# Patient Record
Sex: Female | Born: 1990 | Hispanic: Yes | Marital: Married | Smoking: Never smoker
Health system: Southern US, Community
[De-identification: ages and names within clinical notes are randomized; demographics above are authoritative.]

## PROBLEM LIST (undated history)

## (undated) ENCOUNTER — Inpatient Hospital Stay (HOSPITAL_COMMUNITY): Payer: Self-pay

## (undated) DIAGNOSIS — Z789 Other specified health status: Secondary | ICD-10-CM

## (undated) HISTORY — PX: APPENDECTOMY: SHX54

---

## 2017-08-12 ENCOUNTER — Other Ambulatory Visit: Payer: Self-pay | Admitting: Nurse Practitioner

## 2017-08-12 DIAGNOSIS — N63 Unspecified lump in unspecified breast: Secondary | ICD-10-CM

## 2017-08-25 ENCOUNTER — Inpatient Hospital Stay (HOSPITAL_COMMUNITY): Payer: Self-pay

## 2017-08-25 ENCOUNTER — Inpatient Hospital Stay (HOSPITAL_COMMUNITY)
Admission: AD | Admit: 2017-08-25 | Discharge: 2017-08-25 | Disposition: A | Discharge status: Home / Self Care | Payer: Self-pay | Source: Ambulatory Visit | Attending: Obstetrics & Gynecology | Admitting: Obstetrics & Gynecology

## 2017-08-25 ENCOUNTER — Encounter (HOSPITAL_COMMUNITY): Payer: Self-pay | Admitting: *Deleted

## 2017-08-25 DIAGNOSIS — O209 Hemorrhage in early pregnancy, unspecified: Secondary | ICD-10-CM

## 2017-08-25 DIAGNOSIS — Z3491 Encounter for supervision of normal pregnancy, unspecified, first trimester: Secondary | ICD-10-CM

## 2017-08-25 DIAGNOSIS — R109 Unspecified abdominal pain: Secondary | ICD-10-CM | POA: Insufficient documentation

## 2017-08-25 DIAGNOSIS — Z3A01 Less than 8 weeks gestation of pregnancy: Secondary | ICD-10-CM | POA: Insufficient documentation

## 2017-08-25 DIAGNOSIS — O26892 Other specified pregnancy related conditions, second trimester: Secondary | ICD-10-CM | POA: Insufficient documentation

## 2017-08-25 DIAGNOSIS — O208 Other hemorrhage in early pregnancy: Secondary | ICD-10-CM | POA: Insufficient documentation

## 2017-08-25 HISTORY — DX: Other specified health status: Z78.9

## 2017-08-25 LAB — CBC
HEMATOCRIT: 39.3 % (ref 36.0–46.0)
Hemoglobin: 13.7 g/dL (ref 12.0–15.0)
MCH: 29.8 pg (ref 26.0–34.0)
MCHC: 34.9 g/dL (ref 30.0–36.0)
MCV: 85.6 fL (ref 78.0–100.0)
Platelets: 311 10*3/uL (ref 150–400)
RBC: 4.59 MIL/uL (ref 3.87–5.11)
RDW: 12.8 % (ref 11.5–15.5)
WBC: 10.1 10*3/uL (ref 4.0–10.5)

## 2017-08-25 LAB — ABO/RH: ABO/RH(D): O POS

## 2017-08-25 LAB — URINALYSIS, ROUTINE W REFLEX MICROSCOPIC
Bilirubin Urine: NEGATIVE
GLUCOSE, UA: NEGATIVE mg/dL
Hgb urine dipstick: NEGATIVE
Ketones, ur: NEGATIVE mg/dL
LEUKOCYTES UA: NEGATIVE
Nitrite: NEGATIVE
PH: 6 (ref 5.0–8.0)
Protein, ur: NEGATIVE mg/dL
Specific Gravity, Urine: 1.01 (ref 1.005–1.030)

## 2017-08-25 LAB — OB RESULTS CONSOLE RUBELLA ANTIBODY, IGM: Rubella: IMMUNE

## 2017-08-25 LAB — HCG, QUANTITATIVE, PREGNANCY: hCG, Beta Chain, Quant, S: 107508 m[IU]/mL — ABNORMAL HIGH (ref <5)

## 2017-08-25 LAB — OB RESULTS CONSOLE RPR: RPR: NONREACTIVE

## 2017-08-25 LAB — WET PREP, GENITAL
SPERM: NONE SEEN
Trich, Wet Prep: NONE SEEN
YEAST WET PREP: NONE SEEN

## 2017-08-25 LAB — OB RESULTS CONSOLE HIV ANTIBODY (ROUTINE TESTING): HIV: NONREACTIVE

## 2017-08-25 LAB — OB RESULTS CONSOLE HEPATITIS B SURFACE ANTIGEN: Hepatitis B Surface Ag: NEGATIVE

## 2017-08-25 LAB — OB RESULTS CONSOLE GC/CHLAMYDIA: GC PROBE AMP, GENITAL: NEGATIVE

## 2017-08-25 NOTE — MAU Provider Note (Signed)
Chief Complaint: Abdominal Pain and Vaginal Bleeding   None     SUBJECTIVE HPI: Sarah Miles is a 26 y.o. G1P0 at [redacted]w[redacted]d by LMP who presents to maternity admissions reporting vaginal spotting 2 weeks ago and again today associated with mild cramping. She is active, doing dance and Zumba twice weekly. She reports intercourse within the last 3 days.  She has not tried any treatments, nothing makes the bleeding more or less.  There are no other associated symptoms.  She denies vaginal itching/burning, urinary symptoms, h/a, dizziness, n/v, or fever/chills.     HPI  Past Medical History:  Diagnosis Date  . Medical history non-contributory    Past Surgical History:  Procedure Laterality Date  . APPENDECTOMY     Social History   Social History  . Marital status: Married    Spouse name: N/A  . Number of children: N/A  . Years of education: N/A   Occupational History  . Not on file.   Social History Main Topics  . Smoking status: Never Smoker  . Smokeless tobacco: Never Used  . Alcohol use No  . Drug use: No  . Sexual activity: Yes   Other Topics Concern  . Not on file   Social History Narrative  . No narrative on file   No current facility-administered medications on file prior to encounter.    No current outpatient prescriptions on file prior to encounter.   No Known Allergies  ROS:  Review of Systems  Constitutional: Negative for chills, fatigue and fever.  Respiratory: Negative for shortness of breath.   Cardiovascular: Negative for chest pain.  Gastrointestinal: Positive for abdominal pain.  Genitourinary: Positive for pelvic pain and vaginal bleeding. Negative for difficulty urinating, dysuria, flank pain, vaginal discharge and vaginal pain.  Musculoskeletal: Negative for back pain.  Neurological: Negative for dizziness and headaches.  Psychiatric/Behavioral: Negative.      I have reviewed patient's Past Medical Hx, Surgical Hx, Family Hx, Social Hx,  medications and allergies.   Physical Exam  Patient Vitals for the past 24 hrs:  BP Temp Temp src Pulse Resp  08/25/17 1157 103/64 98.7 F (37.1 C) Oral 61 16   Constitutional: Well-developed, well-nourished female in no acute distress.  Cardiovascular: normal rate Respiratory: normal effort GI: Abd soft, non-tender. Pos BS x 4 MS: Extremities nontender, no edema, normal ROM Neurologic: Alert and oriented x 4.  GU: Neg CVAT.  PELVIC EXAM: Cervix pink, visually closed, without lesion, scant brown discharge, vaginal walls and external genitalia normal Bimanual exam: Cervix 0/long/high, firm, anterior, neg CMT, uterus nontender, slightly enlarged, adnexa without tenderness, enlargement, or mass   LAB RESULTS Results for orders placed or performed during the hospital encounter of 08/25/17 (from the past 24 hour(s))  Urinalysis, Routine w reflex microscopic     Status: None   Collection Time: 08/25/17 12:05 PM  Result Value Ref Range   Color, Urine YELLOW YELLOW   APPearance CLEAR CLEAR   Specific Gravity, Urine 1.010 1.005 - 1.030   pH 6.0 5.0 - 8.0   Glucose, UA NEGATIVE NEGATIVE mg/dL   Hgb urine dipstick NEGATIVE NEGATIVE   Bilirubin Urine NEGATIVE NEGATIVE   Ketones, ur NEGATIVE NEGATIVE mg/dL   Protein, ur NEGATIVE NEGATIVE mg/dL   Nitrite NEGATIVE NEGATIVE   Leukocytes, UA NEGATIVE NEGATIVE  hCG, quantitative, pregnancy     Status: Abnormal   Collection Time: 08/25/17 12:30 PM  Result Value Ref Range   hCG, Beta Chain, Quant, S 107,508 (H) <5 mIU/mL  CBC     Status: None   Collection Time: 08/25/17 12:34 PM  Result Value Ref Range   WBC 10.1 4.0 - 10.5 K/uL   RBC 4.59 3.87 - 5.11 MIL/uL   Hemoglobin 13.7 12.0 - 15.0 g/dL   HCT 16.139.3 09.636.0 - 04.546.0 %   MCV 85.6 78.0 - 100.0 fL   MCH 29.8 26.0 - 34.0 pg   MCHC 34.9 30.0 - 36.0 g/dL   RDW 40.912.8 81.111.5 - 91.415.5 %   Platelets 311 150 - 400 K/uL  ABO/Rh     Status: None (Preliminary result)   Collection Time: 08/25/17 12:34 PM   Result Value Ref Range   ABO/RH(D) O POS   Wet prep, genital     Status: Abnormal   Collection Time: 08/25/17 12:50 PM  Result Value Ref Range   Yeast Wet Prep HPF POC NONE SEEN NONE SEEN   Trich, Wet Prep NONE SEEN NONE SEEN   Clue Cells Wet Prep HPF POC PRESENT (A) NONE SEEN   WBC, Wet Prep HPF POC MODERATE (A) NONE SEEN   Sperm NONE SEEN     --/--/O POS (09/03 1234)  IMAGING [place] Ob Comp Less 14 Wks  Result Date: 08/25/2017 CLINICAL DATA:  26 year old pregnant female presents with spotting. EDC by LMP: 04/13/2018, projecting to an expected gestational age of [redacted] weeks 0 days. EXAM: OBSTETRIC <14 WK US AND TRANSVAGINAL OB US TECHNIQUE: Both transabdominal and transvaginal ultrasound examinations were performed for complete evaluation of the gestation as well as the maternal uterus, adnexal regions, and pelvic cul-de-sac. Transvaginal technique was performed to assess early pregnancy. COMPARISON:  None. FINDINGS: Intrauterine gestational sac: Single intrauterine gestational sac is normal in size, shape and position. Yolk sac:  Visualized. Embryo:  Visualized. Embryonic Cardiac Activity: Regular rate and rhythm. Embryonic Heart Rate: 144  bpm CRL:  10.6  mm   7 w   1 d                  US EDC: 04/12/2018 Subchorionic hemorrhage: Small perigestational bleed in the lower cavity measuring 1.8 x 0.5 cm, involving approximately 30% of the gestational sac circumference. Maternal uterus/adnexae: Left ovary measures 3.7 x 2.8 x 2.2 cm and contains a corpus luteum. Right ovary measures 2.9 x 2.1 x 2.8 cm. No suspicious ovarian or adnexal masses. No uterine fibroids. No abnormal free fluid in the pelvis. IMPRESSION: 1. Single living intrauterine gestation at 7 weeks 1 day by crown-rump length, concordant with provided menstrual dating . 2. Small perigestational bleed.  Normal embryonic cardiac activity. 3. No ovarian or adnexal abnormality. Electronically Signed   By: Delbert PhenixJason A Poff M.D.   On: 08/25/2017 13:57    [place] Ob Transvaginal  Result Date: 08/25/2017 CLINICAL DATA:  26 year old pregnant female presents with spotting. EDC by LMP: 04/13/2018, projecting to an expected gestational age of [redacted] weeks 0 days. EXAM: OBSTETRIC <14 WK US AND TRANSVAGINAL OB US TECHNIQUE: Both transabdominal and transvaginal ultrasound examinations were performed for complete evaluation of the gestation as well as the maternal uterus, adnexal regions, and pelvic cul-de-sac. Transvaginal technique was performed to assess early pregnancy. COMPARISON:  None. FINDINGS: Intrauterine gestational sac: Single intrauterine gestational sac is normal in size, shape and position. Yolk sac:  Visualized. Embryo:  Visualized. Embryonic Cardiac Activity: Regular rate and rhythm. Embryonic Heart Rate: 144  bpm CRL:  10.6  mm   7 w   1 d  [place] EDC: 04/12/2018 Subchorionic hemorrhage: Small perigestational bleed in the lower cavity measuring 1.8 x 0.5 cm, involving approximately 30% of the gestational sac circumference. Maternal uterus/adnexae: Left ovary measures 3.7 x 2.8 x 2.2 cm and contains a corpus luteum. Right ovary measures 2.9 x 2.1 x 2.8 cm. No suspicious ovarian or adnexal masses. No uterine fibroids. No abnormal free fluid in the pelvis. IMPRESSION: 1. Single living intrauterine gestation at 7 weeks 1 day by crown-rump length, concordant with provided menstrual dating . 2. Small perigestational bleed.  Normal embryonic cardiac activity. 3. No ovarian or adnexal abnormality. Electronically Signed   By: Delbert Phenix M.D.   On: 08/25/2017 13:57    MAU Management/MDM: Ordered labs and reviewed results.  IUP noted on today's [place]. Small subchorionic hemorrhage.  Bleeding precautions/reasons to return reviewed.  Pt to take it easy for a few days but may resume normal activities when bleeding resolves.  F/U at [facility] as scheduled.  Pt stable at time of discharge.  ASSESSMENT 1. Normal IUP (intrauterine pregnancy) on prenatal ultrasound,  first trimester   2. Vaginal bleeding in pregnancy, first trimester     PLAN Discharge home with bleeding precautions  Allergies as of 08/25/2017   No Known Allergies     Medication List    You have not been prescribed any medications.          Discharge Care Instructions        Start     Ordered   08/25/17 0000  Discharge patient    Question Answer Comment  Discharge disposition 01-Home or Self Care   Discharge patient date 08/25/2017      08/25/17 1354     Follow-up Information    Department, [facility] Follow up.   Why:  O proveedor prenatal de su eleccin, consulte la lista. Regrese a MAU segn sea necesario para emergencias. Contact information: [address] [place] [address] [phone]           Sharen Counter Certified Nurse-Midwife 08/25/2017  2:03 PM

## 2017-08-25 NOTE — MAU Note (Signed)
Pt C/O ovarian pain, sometimes on right, sometimes on the left, intermittently since 2 weeks ago.  Had pink discharge on 8/27, yesterday had dark red discharge, continues to have spotting today.  Pos HPT & pos UPT @ [facility] on 8/15.  [facility]

## 2017-08-26 LAB — HIV ANTIBODY (ROUTINE TESTING W REFLEX): HIV SCREEN 4TH GENERATION: NONREACTIVE

## 2017-08-28 LAB — GC/CHLAMYDIA PROBE AMP (~~LOC~~) NOT AT ARMC
Chlamydia: NEGATIVE
Neisseria Gonorrhea: NEGATIVE

## 2017-09-02 ENCOUNTER — Ambulatory Visit
Admission: RE | Admit: 2017-09-02 | Discharge: 2017-09-02 | Disposition: A | Discharge status: Home / Self Care | Payer: No Typology Code available for payment source | Source: Ambulatory Visit | Attending: Nurse Practitioner | Admitting: Nurse Practitioner

## 2017-09-02 DIAGNOSIS — N63 Unspecified lump in unspecified breast: Secondary | ICD-10-CM

## 2017-09-08 ENCOUNTER — Other Ambulatory Visit (HOSPITAL_COMMUNITY): Payer: Self-pay | Admitting: Nurse Practitioner

## 2017-09-08 DIAGNOSIS — Z3682 Encounter for antenatal screening for nuchal translucency: Secondary | ICD-10-CM

## 2017-09-08 DIAGNOSIS — Z3A13 13 weeks gestation of pregnancy: Secondary | ICD-10-CM

## 2017-09-08 LAB — OB RESULTS CONSOLE VARICELLA ZOSTER ANTIBODY, IGG: Varicella: IMMUNE

## 2017-09-29 ENCOUNTER — Encounter (HOSPITAL_COMMUNITY): Payer: Self-pay | Admitting: Nurse Practitioner

## 2017-10-06 ENCOUNTER — Ambulatory Visit (HOSPITAL_COMMUNITY)
Admission: RE | Admit: 2017-10-06 | Discharge: 2017-10-06 | Disposition: A | Discharge status: Home / Self Care | Payer: Self-pay | Source: Ambulatory Visit | Attending: Nurse Practitioner | Admitting: Nurse Practitioner

## 2017-10-06 ENCOUNTER — Encounter (HOSPITAL_COMMUNITY): Payer: Self-pay

## 2017-10-06 DIAGNOSIS — Z3A13 13 weeks gestation of pregnancy: Secondary | ICD-10-CM | POA: Insufficient documentation

## 2017-10-06 DIAGNOSIS — Z3682 Encounter for antenatal screening for nuchal translucency: Secondary | ICD-10-CM | POA: Insufficient documentation

## 2017-10-07 ENCOUNTER — Encounter: Payer: Self-pay | Admitting: Obstetrics

## 2017-10-07 ENCOUNTER — Ambulatory Visit (INDEPENDENT_AMBULATORY_CARE_PROVIDER_SITE_OTHER): Payer: Self-pay | Admitting: Obstetrics

## 2017-10-07 VITALS — BP 105/67 | HR 73 | Wt 122.0 lb

## 2017-10-07 DIAGNOSIS — Z3401 Encounter for supervision of normal first pregnancy, first trimester: Secondary | ICD-10-CM | POA: Diagnosis not present

## 2017-10-07 DIAGNOSIS — Z3400 Encounter for supervision of normal first pregnancy, unspecified trimester: Secondary | ICD-10-CM

## 2017-10-07 NOTE — Progress Notes (Signed)
Pt states she is doing well at this time. 

## 2017-10-07 NOTE — Progress Notes (Signed)
Subjective:    Sarah Miles is being seen today for her first obstetrical visit.  This is a planned pregnancy. She is at [redacted]w[redacted]d gestation. Her obstetrical history is significant for none. Relationship with FOB: spouse, living together. Patient does intend to breast feed. Pregnancy history fully reviewed.  The information documented in the HPI was reviewed and verified.  Menstrual History: OB History    Gravida Para Term Preterm AB Living   1             SAB TAB Ectopic Multiple Live Births                  Patient's last menstrual period was 07/07/2017 (exact date).    Past Medical History:  Diagnosis Date  . Medical history non-contributory     Past Surgical History:  Procedure Laterality Date  . APPENDECTOMY       (Not in a hospital admission) No Known Allergies  Social History  Substance Use Topics  . Smoking status: Never Smoker  . Smokeless tobacco: Never Used  . Alcohol use No    Family History  Problem Relation Age of Onset  . Diabetes Maternal Aunt   . Diabetes Paternal Grandfather      Review of Systems Constitutional: negative for weight loss Gastrointestinal: negative for vomiting Genitourinary:negative for genital lesions and vaginal discharge and dysuria Musculoskeletal:negative for back pain Behavioral/Psych: negative for abusive relationship, depression, illegal drug usage and tobacco use    Objective:    BP 105/67   Pulse 73   Wt 122 lb (55.3 kg)   LMP 07/07/2017 (Exact Date)   BMI 24.23 kg/m  General Appearance:    Alert, cooperative, no distress, appears stated age  Head:    Normocephalic, without obvious abnormality, atraumatic  Eyes:    PERRL, conjunctiva/corneas clear, EOM's intact, fundi    benign, both eyes  Ears:    Normal TM's and external ear canals, both ears  Nose:   Nares normal, septum midline, mucosa normal, no drainage    or sinus tenderness  Throat:   Lips, mucosa, and tongue normal; teeth and gums normal  Neck:    Supple, symmetrical, trachea midline, no adenopathy;    thyroid:  no enlargement/tenderness/nodules; no carotid   bruit or JVD  Back:     Symmetric, no curvature, ROM normal, no CVA tenderness  Lungs:     Clear to auscultation bilaterally, respirations unlabored  Chest Wall:    No tenderness or deformity   Heart:    Regular rate and rhythm, S1 and S2 normal, no murmur, rub   or gallop  Breast Exam:    No tenderness, masses, or nipple abnormality  Abdomen:     Soft, non-tender, bowel sounds active all four quadrants,    no masses, no organomegaly  Genitalia:    Normal female without lesion, discharge or tenderness  Extremities:   Extremities normal, atraumatic, no cyanosis or edema  Pulses:   2+ and symmetric all extremities  Skin:   Skin color, texture, turgor normal, no rashes or lesions  Lymph nodes:   Cervical, supraclavicular, and axillary nodes normal  Neurologic:   CNII-XII intact, normal strength, sensation and reflexes    throughout      Lab Review Urine pregnancy test Labs reviewed yes Radiologic studies reviewed no Assessment:    Pregnancy at [redacted]w[redacted]d weeks    Plan:     1. Supervision of normal first pregnancy, antepartum   Prenatal vitamins.  Counseling provided regarding continued  use of seat belts, cessation of alcohol consumption, smoking or use of illicit drugs; infection precautions i.e., influenza/TDAP immunizations, toxoplasmosis,CMV, parvovirus, listeria and varicella; workplace safety, exercise during pregnancy; routine dental care, safe medications, sexual activity, hot tubs, saunas, pools, travel, caffeine use, fish and methlymercury, potential toxins, hair treatments, varicose veins Weight gain recommendations per IOM guidelines reviewed: underweight/BMI< 18.5--> gain 28 - 40 lbs; normal weight/BMI 18.5 - 24.9--> gain 25 - 35 lbs; overweight/BMI 25 - 29.9--> gain 15 - 25 lbs; obese/BMI >30->gain  11 - 20 lbs Problem list reviewed and updated. FIRST/CF mutation  testing/NIPT/QUAD SCREEN/fragile X/Ashkenazi Jewish population testing/Spinal muscular atrophy discussed: declined. Role of ultrasound in pregnancy discussed; fetal survey: requested. Amniocentesis discussed: not indicated. VBAC calculator score: VBAC consent form provided No orders of the defined types were placed in this encounter.  No orders of the defined types were placed in this encounter.   Follow up in 4 weeks. 50% of 20 min visit spent on counseling and coordination of care.

## 2017-10-14 ENCOUNTER — Other Ambulatory Visit: Payer: Self-pay

## 2017-10-14 ENCOUNTER — Encounter: Payer: Self-pay | Admitting: *Deleted

## 2017-10-16 ENCOUNTER — Encounter: Payer: Self-pay | Admitting: *Deleted

## 2017-10-17 ENCOUNTER — Encounter: Payer: Self-pay | Admitting: Family Medicine

## 2017-10-17 DIAGNOSIS — Z3400 Encounter for supervision of normal first pregnancy, unspecified trimester: Secondary | ICD-10-CM | POA: Insufficient documentation

## 2017-11-04 ENCOUNTER — Encounter: Payer: Self-pay | Admitting: Obstetrics

## 2017-12-23 NOTE — L&D Delivery Note (Signed)
Patient: Sarah Miles MRN: [id number]  GBS status: negative  Patient is a 27 y.o. now G1P1001 s/p NSVD at 5537w0d, who was admitted for SOL.  Delivery Note At 7:55 AM a viable female was delivered via Vaginal, Spontaneous (Presentation:LO ).  APGAR: 8, 9; weight pending Placenta status: intact  Cord: 3-vessel; with the following complications: Nuchal cord  Anesthesia:  IV Fentanyl Episiotomy: None Lacerations: 2nd degree Perineal Suture Repair: 3.0 Moncrul Est. Blood Loss (mL): 150  Mom to postpartum.  Baby to Couplet care / Skin to Skin.   Sarah FanningJulie P. Desi Carby, MD OB Fellow 04/06/18, 8:15 AM

## 2018-03-11 LAB — OB RESULTS CONSOLE GBS: GBS: NEGATIVE

## 2018-03-11 LAB — OB RESULTS CONSOLE GC/CHLAMYDIA
CHLAMYDIA, DNA PROBE: NEGATIVE
GC PROBE AMP, GENITAL: NEGATIVE

## 2018-04-03 ENCOUNTER — Inpatient Hospital Stay (HOSPITAL_COMMUNITY)
Admission: AD | Admit: 2018-04-03 | Discharge: 2018-04-03 | Disposition: A | Discharge status: Home / Self Care | Payer: Self-pay | Source: Ambulatory Visit | Attending: Obstetrics and Gynecology | Admitting: Obstetrics and Gynecology

## 2018-04-03 ENCOUNTER — Encounter (HOSPITAL_COMMUNITY): Payer: Self-pay | Admitting: *Deleted

## 2018-04-03 DIAGNOSIS — O23593 Infection of other part of genital tract in pregnancy, third trimester: Secondary | ICD-10-CM | POA: Insufficient documentation

## 2018-04-03 DIAGNOSIS — B9689 Other specified bacterial agents as the cause of diseases classified elsewhere: Secondary | ICD-10-CM | POA: Insufficient documentation

## 2018-04-03 DIAGNOSIS — O9989 Other specified diseases and conditions complicating pregnancy, childbirth and the puerperium: Secondary | ICD-10-CM

## 2018-04-03 DIAGNOSIS — Z3A38 38 weeks gestation of pregnancy: Secondary | ICD-10-CM | POA: Insufficient documentation

## 2018-04-03 DIAGNOSIS — N760 Acute vaginitis: Secondary | ICD-10-CM

## 2018-04-03 LAB — WET PREP, GENITAL
Sperm: NONE SEEN
Trich, Wet Prep: NONE SEEN
Yeast Wet Prep HPF POC: NONE SEEN

## 2018-04-03 LAB — AMNISURE RUPTURE OF MEMBRANE (ROM) NOT AT ARMC: Amnisure ROM: NEGATIVE

## 2018-04-03 LAB — POCT FERN TEST: POCT FERN TEST: NEGATIVE

## 2018-04-03 MED ORDER — METRONIDAZOLE 500 MG PO TABS: 500.0000 mg | ORAL_TABLET | Freq: Two times a day (BID) | ORAL | 0 refills | Status: DC

## 2018-04-03 NOTE — MAU Provider Note (Addendum)
S: [name] is a 27 y.o. G1P0 at [redacted]w[redacted]d  who presents to MAU today complaining of leaking of fluid since today. She denies vaginal bleeding. She denies contractions. She reports normal fetal movement.    O: BP (!) 108/56 (BP Location: Right Arm)   Pulse 68   Temp 98.6 F (37 C) (Oral)   Resp 18   Ht 5' (1.524 m)   Wt 61.7 kg (136 lb 1.9 oz)   LMP 07/07/2017 (Exact Date)   BMI 26.58 kg/m  GENERAL: Well-developed, well-nourished female in no acute distress.  HEAD: Normocephalic, atraumatic.  CHEST: Normal effort of breathing, regular heart rate ABDOMEN: Soft, nontender, gravid PELVIC: Normal external female genitalia. Vagina is pink and rugated. Cervix with normal contour, no lesions. Normal discharge.  No pooling.       Fetal Monitoring: Baseline: 130 bpm Variability: 6-25 bpm Accelerations: Present, moderate Decelerations: Absent Contractions: None  Results for orders placed or performed during the hospital encounter of 04/03/18 (from the past 24 hour(s))  Amnisure rupture of membrane (rom)not at [facility]     Status: None   Collection Time: 04/03/18  3:19 PM  Result Value Ref Range   Amnisure ROM NEGATIVE   POCT fern test     Status: None   Collection Time: 04/03/18  3:31 PM  Result Value Ref Range   POCT Fern Test Negative = intact amniotic membranes   Wet prep, genital     Status: Abnormal   Collection Time: 04/03/18  4:13 PM  Result Value Ref Range   Yeast Wet Prep HPF POC NONE SEEN NONE SEEN   Trich, Wet Prep NONE SEEN NONE SEEN   Clue Cells Wet Prep HPF POC PRESENT (A) NONE SEEN   WBC, Wet Prep HPF POC MODERATE (A) NONE SEEN   Sperm NONE SEEN      A: SIUP at [redacted]w[redacted]d  Membranes intact  P: Discharge home  Prescribe Flagyl   Shirlee Limerick, Student-PA 04/03/2018 5:11 PM    CNM attestation:  I have seen and examined this patient and agree with above documentation in the PA's note.   Sarah Miles is a 27 y.o. G1P0 at [redacted]w[redacted]d reporting being  told that she had SROM at the [facility] today.  +FM, denies LOF, VB, contractions, vaginal discharge.  PE: Patient Vitals for the past 24 hrs:  BP Temp Temp src Pulse Resp Height Weight  04/03/18 1733 108/66 - - 67 18 - -  04/03/18 1429 (!) 108/56 98.6 F (37 C) Oral 68 18 5' (1.524 m) 136 lb 1.9 oz (61.7 kg)   Gen: calm comfortable, NAD Resp: normal effort, no distress Heart: Regular rate Abd: Soft, NT, gravid, S=D  FHR: Baseline 130, mod Variability, pos accels, no decels Toco: UC's Q infrequent  ROS, labs, PMH reviewed  Orders Placed This Encounter  Procedures  . Wet prep, genital  . Amnisure rupture of membrane (rom)not at [facility]  . POCT fern test   Meds ordered this encounter  Medications  . metroNIDAZOLE (FLAGYL) 500 MG tablet    Sig: Take 1 tablet (500 mg total) by mouth 2 (two) times daily for 7 days.    Dispense:  14 tablet    Refill:  0    Order Specific Question:   Supervising Provider    Answer:   Samara Snide    MDM   Assessment: 1. Bacterial vaginosis     Plan: - Discharge home in stable condition. - Labor precautions, warning signs reviewed.  -  Follow-up as scheduled at your doctor's office for next prenatal visit or sooner as needed if symptoms worsen. - Return to maternity admissions symptoms worsen  Marylene LandKooistra, Mckinzie Saksa Lorraine, [facility] HospitalCNM 04/04/2018 10:34 AM

## 2018-04-03 NOTE — MAU Provider Note (Signed)
Patient Sarah Miles is a 27 y.o. G1P0 At 5275w4d [facility] visit today for SROM.   [facility] was told she was ruptured by her provider. She says that they "did a test with cotton and sent it to the lab" and then came back and told her to come to the hospital.  History     CSN: [id number]  Arrival date and time: 04/03/18 1415   None     No chief complaint on file.  Vaginal Discharge  The patient's primary symptoms include vaginal discharge. The patient's pertinent negatives include no genital odor. This is a new problem. The current episode started yesterday. The problem occurs constantly. Pertinent negatives include no abdominal pain, constipation, diarrhea, headaches or hematuria. The vaginal discharge was milky, watery and white. There has been no bleeding. She has not been passing clots. She has not been passing tissue. Nothing aggravates the symptoms. She has tried nothing for the symptoms.    OB History    Gravida  1   Para      Term      Preterm      AB      Living        SAB      TAB      Ectopic      Multiple      Live Births              Past Medical History:  Diagnosis Date  . Medical history non-contributory     Past Surgical History:  Procedure Laterality Date  . APPENDECTOMY      Family History  Problem Relation Age of Onset  . Diabetes Maternal Aunt   . Diabetes Paternal Grandfather     Social History   Tobacco Use  . Smoking status: Never Smoker  . Smokeless tobacco: Never Used  Substance Use Topics  . Alcohol use: No  . Drug use: No    Allergies: No Known Allergies  Medications Prior to Admission  Medication Sig Dispense Refill Last Dose  . Prenatal Vit-Fe Fumarate-FA (PRENATAL VITAMIN PO) Take by mouth.   Taking    Review of Systems  Constitutional: Negative.   HENT: Negative.   Respiratory: Negative.   Cardiovascular: Negative.   Gastrointestinal: Negative.  Negative for abdominal  pain, constipation and diarrhea.  Genitourinary: Positive for vaginal discharge. Negative for hematuria.  Musculoskeletal: Negative.   Neurological: Negative.  Negative for headaches.  Psychiatric/Behavioral: Negative.    Physical Exam   Blood pressure (!) 108/56, pulse 68, temperature 98.6 F (37 C), temperature source Oral, resp. rate 18, height 5' (1.524 m), weight 136 lb 1.9 oz (61.7 kg), last menstrual period 07/07/2017.  Physical Exam  Constitutional: She is oriented to person, place, and time. She appears well-developed.  HENT:  Head: Normocephalic.  Eyes: Pupils are equal, round, and reactive to light.  Neck: Normal range of motion.  Respiratory: Effort normal.  GI: Soft.  Musculoskeletal: Normal range of motion.  Neurological: She is alert and oriented to person, place, and time.  Skin: Skin is warm and dry.    MAU Course  Procedures  MDM -amnisure-neg -sterile fern-neg NST: 135 bpm, mod var, present acel, neg decels, occasional contractions but patient does not endorse contractions.  Wet prep: pos for clue.   Assessment and Plan   1. Bacterial vaginosis    2.Discharge home with RX for BV and plans to keep prenatal visit next week.  3. Reviewed warning  signs and when to return to the MAU.  4. All questions answered; patient stable for discharge.   Sarah Miles 04/03/2018, 4:24 PM

## 2018-04-03 NOTE — Discharge Instructions (Signed)
Vaginosis bacteriana (Bacterial Vaginosis) La vaginosis bacteriana es una infeccin de la vagina. Se produce cuando crece una cantidad excesiva de grmenes normales (bacterias sanas) en la vagina. Esta infeccin aumenta el riesgo de contraer otras infecciones de transmisin sexual. El tratamiento de esta infeccin puede ayudar a reducir el riesgo de otras infecciones, como:  Clamidia.  Gonorrea.  VIH.  Herpes. CUIDADOS EN EL HOGAR  Tome los medicamentos tal como se lo indic su mdico.  Finalice la prescripcin completa, aunque comience a sentirse mejor.  Comunique a sus compaeros sexuales que sufre una infeccin. Deben consultar a su mdico para iniciar un tratamiento.  Durante el tratamiento: ? Evite mantener relaciones sexuales o use preservativos de la forma correcta. ? No se haga duchas vaginales. ? No consuma alcohol a menos que el mdico lo autorice. ? No amamante a menos que el mdico la autorice.  SOLICITE AYUDA SI:  No mejora luego de 3 das de tratamiento.  Observa una secrecin (prdida) de color gris ms abundante que proviene de la vagina.  Siente ms dolor que antes.  Tiene fiebre.  ASEGRESE DE QUE:  Comprende estas instrucciones.  Controlar su afeccin.  Recibir ayuda de inmediato si no mejora o si empeora.  Esta informacin no tiene como fin reemplazar el consejo del mdico. Asegrese de hacerle al mdico cualquier pregunta que tenga. Document Released: 03/07/2009 Document Revised: 04/01/2016 Document Reviewed: 07/21/2013 Elsevier Interactive Patient Education  2017 Elsevier Inc.  

## 2018-04-03 NOTE — MAU Note (Signed)
Pt states she was wet last night & then this morning, [facility], was told she is ruptured.  Fluid is clear.  Denies contractions or bleeding.  Reports good fetal movement.

## 2018-04-06 ENCOUNTER — Other Ambulatory Visit: Payer: Self-pay

## 2018-04-06 ENCOUNTER — Inpatient Hospital Stay (HOSPITAL_COMMUNITY)
Admission: AD | Admit: 2018-04-06 | Discharge: 2018-04-08 | DRG: 807 | Disposition: A | Discharge status: Home / Self Care | Payer: Medicaid Other | Source: Ambulatory Visit | Attending: Obstetrics & Gynecology | Admitting: Obstetrics & Gynecology

## 2018-04-06 ENCOUNTER — Encounter (HOSPITAL_COMMUNITY): Payer: Self-pay | Admitting: *Deleted

## 2018-04-06 DIAGNOSIS — Z3A39 39 weeks gestation of pregnancy: Secondary | ICD-10-CM

## 2018-04-06 DIAGNOSIS — Z3483 Encounter for supervision of other normal pregnancy, third trimester: Secondary | ICD-10-CM | POA: Diagnosis present

## 2018-04-06 LAB — CBC
HEMATOCRIT: 36.9 % (ref 36.0–46.0)
Hemoglobin: 12.7 g/dL (ref 12.0–15.0)
MCH: 28.9 pg (ref 26.0–34.0)
MCHC: 34.4 g/dL (ref 30.0–36.0)
MCV: 83.9 fL (ref 78.0–100.0)
Platelets: 318 10*3/uL (ref 150–400)
RBC: 4.4 MIL/uL (ref 3.87–5.11)
RDW: 13.7 % (ref 11.5–15.5)
WBC: 9.3 10*3/uL (ref 4.0–10.5)

## 2018-04-06 LAB — TYPE AND SCREEN
ABO/RH(D): O POS
ANTIBODY SCREEN: NEGATIVE

## 2018-04-06 LAB — GC/CHLAMYDIA PROBE AMP (~~LOC~~) NOT AT ARMC
CHLAMYDIA, DNA PROBE: NEGATIVE
Neisseria Gonorrhea: NEGATIVE

## 2018-04-06 LAB — RPR: RPR Ser Ql: NONREACTIVE

## 2018-04-06 MED ORDER — LACTATED RINGERS IV SOLN
INTRAVENOUS | Status: DC
  Administered 2018-04-06: 04:00:00 via INTRAVENOUS

## 2018-04-06 MED ORDER — BENZOCAINE-MENTHOL 20-0.5 % EX AERO
1.0000 "application " | INHALATION_SPRAY | CUTANEOUS | Status: DC | PRN
  Administered 2018-04-06: 1 via TOPICAL
  Filled 2018-04-06: qty 56

## 2018-04-06 MED ORDER — WITCH HAZEL-GLYCERIN EX PADS: 1.0000 "application " | MEDICATED_PAD | CUTANEOUS | Status: DC | PRN

## 2018-04-06 MED ORDER — DIBUCAINE 1 % RE OINT: 1.0000 "application " | TOPICAL_OINTMENT | RECTAL | Status: DC | PRN

## 2018-04-06 MED ORDER — OXYCODONE-ACETAMINOPHEN 5-325 MG PO TABS: 1.0000 | ORAL_TABLET | ORAL | Status: DC | PRN

## 2018-04-06 MED ORDER — ZOLPIDEM TARTRATE 5 MG PO TABS: 5.0000 mg | ORAL_TABLET | Freq: Every evening | ORAL | Status: DC | PRN

## 2018-04-06 MED ORDER — FENTANYL CITRATE (PF) 100 MCG/2ML IJ SOLN
INTRAMUSCULAR | Status: AC
  Administered 2018-04-06: 100 ug via INTRAVENOUS
  Filled 2018-04-06: qty 2

## 2018-04-06 MED ORDER — LIDOCAINE HCL (PF) 1 % IJ SOLN
30.0000 mL | INTRAMUSCULAR | Status: AC | PRN
  Administered 2018-04-06: 30 mL via SUBCUTANEOUS
  Filled 2018-04-06: qty 30

## 2018-04-06 MED ORDER — OXYTOCIN BOLUS FROM INFUSION
500.0000 mL | Freq: Once | INTRAVENOUS | Status: AC
  Administered 2018-04-06: 500 mL via INTRAVENOUS

## 2018-04-06 MED ORDER — SIMETHICONE 80 MG PO CHEW: 80.0000 mg | CHEWABLE_TABLET | ORAL | Status: DC | PRN

## 2018-04-06 MED ORDER — LACTATED RINGERS IV SOLN: 500.0000 mL | INTRAVENOUS | Status: DC | PRN

## 2018-04-06 MED ORDER — OXYCODONE-ACETAMINOPHEN 5-325 MG PO TABS: 2.0000 | ORAL_TABLET | ORAL | Status: DC | PRN

## 2018-04-06 MED ORDER — ONDANSETRON HCL 4 MG/2ML IJ SOLN: 4.0000 mg | INTRAMUSCULAR | Status: DC | PRN

## 2018-04-06 MED ORDER — FLEET ENEMA 7-19 GM/118ML RE ENEM: 1.0000 | ENEMA | RECTAL | Status: DC | PRN

## 2018-04-06 MED ORDER — ONDANSETRON HCL 4 MG PO TABS: 4.0000 mg | ORAL_TABLET | ORAL | Status: DC | PRN

## 2018-04-06 MED ORDER — IBUPROFEN 600 MG PO TABS
600.0000 mg | ORAL_TABLET | Freq: Four times a day (QID) | ORAL | Status: DC
  Administered 2018-04-06 – 2018-04-08 (×8): 600 mg via ORAL
  Filled 2018-04-06 (×9): qty 1

## 2018-04-06 MED ORDER — FENTANYL CITRATE (PF) 100 MCG/2ML IJ SOLN
100.0000 ug | INTRAMUSCULAR | Status: DC | PRN
  Administered 2018-04-06 (×2): 100 ug via INTRAVENOUS
  Filled 2018-04-06: qty 2

## 2018-04-06 MED ORDER — ONDANSETRON HCL 4 MG/2ML IJ SOLN: 4.0000 mg | Freq: Four times a day (QID) | INTRAMUSCULAR | Status: DC | PRN

## 2018-04-06 MED ORDER — OXYTOCIN 40 UNITS IN LACTATED RINGERS INFUSION - SIMPLE MED
2.5000 [IU]/h | INTRAVENOUS | Status: DC
  Filled 2018-04-06: qty 1000

## 2018-04-06 MED ORDER — PRENATAL MULTIVITAMIN CH
1.0000 | ORAL_TABLET | Freq: Every day | ORAL | Status: DC
  Administered 2018-04-06 – 2018-04-07 (×2): 1 via ORAL
  Filled 2018-04-06 (×2): qty 1

## 2018-04-06 MED ORDER — SENNOSIDES-DOCUSATE SODIUM 8.6-50 MG PO TABS
2.0000 | ORAL_TABLET | ORAL | Status: DC
  Administered 2018-04-07 – 2018-04-08 (×2): 2 via ORAL
  Filled 2018-04-06 (×2): qty 2

## 2018-04-06 MED ORDER — SOD CITRATE-CITRIC ACID 500-334 MG/5ML PO SOLN: 30.0000 mL | ORAL | Status: DC | PRN

## 2018-04-06 MED ORDER — TETANUS-DIPHTH-ACELL PERTUSSIS 5-2.5-18.5 LF-MCG/0.5 IM SUSP: 0.5000 mL | Freq: Once | INTRAMUSCULAR | Status: DC

## 2018-04-06 MED ORDER — ACETAMINOPHEN 325 MG PO TABS: 650.0000 mg | ORAL_TABLET | ORAL | Status: DC | PRN

## 2018-04-06 MED ORDER — COCONUT OIL OIL
1.0000 "application " | TOPICAL_OIL | Status: DC | PRN
  Administered 2018-04-06: 1 via TOPICAL
  Filled 2018-04-06: qty 120

## 2018-04-06 MED ORDER — DIPHENHYDRAMINE HCL 25 MG PO CAPS: 25.0000 mg | ORAL_CAPSULE | Freq: Four times a day (QID) | ORAL | Status: DC | PRN

## 2018-04-06 MED ORDER — ACETAMINOPHEN 325 MG PO TABS
650.0000 mg | ORAL_TABLET | ORAL | Status: DC | PRN
  Filled 2018-04-06: qty 2

## 2018-04-06 NOTE — Lactation Note (Signed)
This note was copied from a baby'Sarah chart. Lactation Consultation Note  Patient Name: Sarah Miles JYNWG'NToday'Sarah Date: 04/06/2018 Reason for consult: Initial assessment;Primapara;1st time breastfeeding;Term  9 hours old FT female who is being exclusively BF by her mother [name] a P1. Baby was asleep and swaddled when entering the room, offered assistance with latch, per parents baby hasn't eaten in +2 hours and mom was already getting sore, she wasn't sure how BF is going or if she was doing it right.  Right nipple already has a small developing crack around the top of the nipple and left nipple looked intact upon examination. Per mom, she was really sore, [facility], even with the intact nipple, in addition of developing nipple trauma she may also be experiencing transient soreness. Mom was able to express 2 drops of colostrum from each breast and fed two to baby and LC rubbed the other two on her nipples. Treatment for sore nipples was reviewed, LC let RN know that mom needs some coconut oil.  Latched baby on to the left breast on football position and she was able to do a deep latch with flanged lips but no sucking reflex elicit, baby fell asleep; only a couple of sucks were observed with breast compressions. LC tried to do some suck training with baby but baby did not wake up, she probably wasn't ready to feed. Asked mom to call for latch assistance the next time [name] ready to take baby to the breast.  Encouraged mom to feed baby STS 8-12 times/24 hours or sooner if feeding cues are present/ Discussed cluster feeding. Reviewed BF brochure (SP), BF resources and feeding diary, parents are aware of LC services and will call PRN.  Maternal Data Formula Feeding for Exclusion: No Has patient been taught Hand Expression?: Yes Does the patient have breastfeeding experience prior to this delivery?: No  Feeding Feeding Type: Breast Fed  LATCH Score Latch:  Repeated attempts needed to sustain latch, nipple held in mouth throughout feeding, stimulation needed to elicit sucking reflex.  Audible Swallowing: A few with stimulation  Type of Nipple: Everted at rest and after stimulation  Comfort (Breast/Nipple): Filling, red/small blisters or bruises, mild/mod discomfort  Hold (Positioning): Assistance needed to correctly position infant at breast and maintain latch.  LATCH Score: 6  Interventions Interventions: Breast feeding basics reviewed;Assisted with latch;Skin to skin;Breast massage;Hand express;Breast compression;Adjust position;Position options;Support pillows;Expressed milk;Coconut oil  Lactation Tools Discussed/Used WIC Program: Yes   Consult Status Consult Status: Follow-up Date: 04/07/18 Follow-up type: In-patient    Sarah Miles Sarah ConstableS Tracy Miles 04/06/2018, 5:07 PM

## 2018-04-06 NOTE — H&P (Addendum)
LABOR AND DELIVERY ADMISSION HISTORY AND PHYSICAL NOTE  Sarah Miles is a 27 y.o. female G1P0 with IUP at [redacted]w[redacted]d by LMP and 7wk U/S presenting for SOL. Onset contractions around 1800 on 4/14. She reports positive fetal movement. She denies leakage of fluid or vaginal bleeding.  Prenatal History/Complications: PNC at [facility] Pregnancy complications:  - Subchorionic hemorrhage early in pregnancy  Past Medical History: Past Medical History:  Diagnosis Date  . Medical history non-contributory     Past Surgical History: Past Surgical History:  Procedure Laterality Date  . APPENDECTOMY      Obstetrical History: OB History    Gravida  1   Para      Term      Preterm      AB      Living        SAB      TAB      Ectopic      Multiple      Live Births              Social History: Social History   Socioeconomic History  . Marital status: Married    Spouse name: Not on file  . Number of children: Not on file  . Years of education: Not on file  . Highest education level: Not on file  Occupational History  . Not on file  Social Needs  . Financial resource strain: Not on file  . Food insecurity:    Worry: Not on file    Inability: Not on file  . Transportation needs:    Medical: Not on file    Non-medical: Not on file  Tobacco Use  . Smoking status: Never Smoker  . Smokeless tobacco: Never Used  Substance and Sexual Activity  . Alcohol use: No  . Drug use: No  . Sexual activity: Yes  Lifestyle  . Physical activity:    Days per week: Not on file    Minutes per session: Not on file  . Stress: Not on file  Relationships  . Social connections:    Talks on phone: Not on file    Gets together: Not on file    Attends religious service: Not on file    Active member of club or organization: Not on file    Attends meetings of clubs or organizations: Not on file    Relationship status: Not on file  Other Topics Concern  . Not on file  Social  History Narrative  . Not on file    Family History: Family History  Problem Relation Age of Onset  . Diabetes Maternal Aunt   . Diabetes Paternal Grandfather     Allergies: No Known Allergies  Medications Prior to Admission  Medication Sig Dispense Refill Last Dose  . metroNIDAZOLE (FLAGYL) 500 MG tablet Take 1 tablet (500 mg total) by mouth 2 (two) times daily for 7 days. 14 tablet 0 04/06/2018 at Unknown time  . Prenatal Vit-Fe Fumarate-FA (PRENATAL VITAMIN PO) Take by mouth.   04/05/2018 at Unknown time     Review of Systems  All systems reviewed and negative except as stated in HPI  Physical Exam Blood pressure 129/81, pulse 75, temperature 98 F (36.7 C), resp. rate 20, height 5' (1.524 m), weight 127 lb 6.4 oz (57.8 kg), last menstrual period 07/07/2017. General appearance: alert, oriented, NAD Lungs: normal respiratory effort Heart: regular rate Abdomen: soft, non-tender; gravid, FH appropriate for GA Extremities: No calf swelling or tenderness Presentation: cephalic Fetal monitoring:  135 bpm, moderate variability, accelerations present, no decelerations Uterine activity: regular every 2-3 minutes Dilation: 6 Effacement (%): 100 Station: -2 Exam by:: B Mosca   Prenatal labs: ABO, Rh: --/--/O POS (09/03 1234) Antibody: negative Rubella: immune RPR: NR HBsAg: negative HIV: Non Reactive (09/03 1234)  GC/Chlamydia: negative GBS: negative 1-hr GTT: WNL, 68 Genetic screening:  negative Anatomy US: WNL  Prenatal Transfer Tool  Maternal Diabetes: No Genetic Screening: Normal Maternal Ultrasounds/Referrals: Normal Fetal Ultrasounds or other Referrals:  None Maternal Substance Abuse:  No Significant Maternal Medications:  None Significant Maternal Lab Results: Lab values include: Group B Strep negative  Results for orders placed or performed during the hospital encounter of 04/06/18 (from the past 24 hour(s))  CBC   Collection Time: 04/06/18  4:12 AM   Result Value Ref Range   WBC 9.3 4.0 - 10.5 K/uL   RBC 4.40 3.87 - 5.11 MIL/uL   Hemoglobin 12.7 12.0 - 15.0 g/dL   HCT 16.136.9 09.636.0 - 04.546.0 %   MCV 83.9 78.0 - 100.0 fL   MCH 28.9 26.0 - 34.0 pg   MCHC 34.4 30.0 - 36.0 g/dL   RDW 40.913.7 81.111.5 - 91.415.5 %   Platelets 318 150 - 400 K/uL    Patient Active Problem List   Diagnosis Date Noted  . Supervision of low-risk first pregnancy, unspecified trimester 10/17/2017    Assessment: Sarah Miles is a 27 y.o. G1P0 at 6677w0d here for SOL. Will admit for expectant labor.  #Labor: stage 2 #Pain: IV pain meds, epidural upon request #FWB:category I #ID:  negative #MOF: breast #MOC:undecided #Circ:  N/A  Wendee BeaversDavid J McMullen 04/06/2018, 4:27 AM    OB FELLOW HISTORY AND PHYSICAL ATTESTATION I have seen and examined this patient; I agree with above documentation in the resident's note.   G1P0 presenting in active labor. Expectant mangement.  GHT Cat I GBS neg  Frederik PearJulie P Lashonna Rieke, MD OB Fellow 04/06/2018, 4:52 AM

## 2018-04-06 NOTE — MAU Note (Signed)
Pt reports painful uc's since 1800. Noticed some bleeding this morning.

## 2018-04-07 LAB — BIRTH TISSUE RECOVERY COLLECTION (PLACENTA DONATION)

## 2018-04-07 NOTE — Progress Notes (Signed)
Post Partum Day 1 Subjective: no complaints, up ad lib, voiding and tolerating PO  Objective: Blood pressure (!) 107/54, pulse 62, temperature 98.6 F (37 C), temperature source Oral, resp. rate 16, height 5' (1.524 m), weight 57.6 kg (127 lb), last menstrual period 07/07/2017, SpO2 99 %, unknown if currently breastfeeding.  Physical Exam:  General: alert, cooperative and no distress Lochia: appropriate Uterine Fundus: firm DVT Evaluation: No evidence of DVT seen on physical exam.  Recent Labs    04/06/18 0412  HGB 12.7  HCT 36.9    Assessment/Plan: Plan for discharge tomorrow, Breastfeeding and Contraception undecided   LOS: 1 day   Sarah AdaJazma Johnica Armwood, DO 04/07/2018, 8:45 AM

## 2018-04-07 NOTE — Lactation Note (Signed)
This note was copied from a baby's chart. Lactation Consultation Note  Patient Name: Sarah Miles ZOXWR'UToday's Date: 04/07/2018 Reason for consult: Follow-up assessment;1st time breastfeeding;Primapara;Term  LC Follow Up Visit:  G1P1 mother whose infant is now 1230 hours of age  Mother had some general breastfeeding [facility].  Reviewed feeding 8-12 times/24 hours or earlier if baby shows feeding cues, how to awaken sleepy infant, breast massage and how to maintain a deep latch.  Mother wanted to latch in the cradle position but had a hard time holding infant in this position.  LC suggested and taught the cross cradle position which mother enjoyed.  Her breasts are soft and nontender.  She states her nipples are sore but no breakdown noted.  Comfort gels with instructions for use given.  Mother requested a manual pump for home use and LC provided with instructions for use.  [facility]  She does not feel like a DEBP will be necessary at this time.    Family member present and supportive.  Mother will call as needed. Maternal Data Formula Feeding for Exclusion: No Has patient been taught Hand Expression?: Yes Does the patient have breastfeeding experience prior to this delivery?: No  Feeding Feeding Type: Breast Fed Length of feed: 15 min  LATCH Score Latch: Grasps breast easily, tongue down, lips flanged, rhythmical sucking.  Audible Swallowing: Spontaneous and intermittent  Type of Nipple: Everted at rest and after stimulation  Comfort (Breast/Nipple): Soft / non-tender  Hold (Positioning): Assistance needed to correctly position infant at breast and maintain latch.  LATCH Score: 9  Interventions Interventions: Breast feeding basics reviewed;Assisted with latch;Skin to skin;Breast massage;Position options;Support pillows;Adjust position;Comfort gels;Hand pump  Lactation Tools Discussed/Used WIC Program:  Yes Pump Review: Setup, frequency, and cleaning;Milk Storage Initiated by:: Leili Eskenazi Date initiated:: 04/07/18   Consult Status Consult Status: Follow-up Date: 04/08/18 Follow-up type: In-patient    Kassius Battiste R Jolayne Branson 04/07/2018, 2:53 PM

## 2018-04-08 DIAGNOSIS — Z3A39 39 weeks gestation of pregnancy: Secondary | ICD-10-CM

## 2018-04-08 MED ORDER — IBUPROFEN 600 MG PO TABS: 600.0000 mg | ORAL_TABLET | Freq: Four times a day (QID) | ORAL | 0 refills | Status: DC

## 2018-04-08 NOTE — Discharge Summary (Addendum)
OB Discharge Summary     Patient Name: Sarah Miles DOB: Jun 24, 1991 MRN: [id number]  Date of admission: 04/06/2018 Delivering MD: Frederik PearEGELE, JULIE P   Date of discharge: 04/08/2018  Admitting diagnosis: 39 WEEKS CTX Intrauterine pregnancy: 183w0d     Secondary diagnosis:  Active Problems:   Indication for care in labor or delivery  Additional problems: none     Discharge diagnosis: Term Pregnancy Delivered                                                                                                Post partum procedures:none  Augmentation: none  Complications: None  Hospital course:  Onset of Labor With Vaginal Delivery     27 y.o. yo G1P1001 at 6483w0d was admitted in Active Labor on 04/06/2018. Patient had an uncomplicated labor course as follows:  Membrane Rupture Time/Date: 6:38 AM ,04/06/2018   Intrapartum Procedures: Episiotomy: None [1]                                         Lacerations:  2nd degree [3];Perineal [11]  Patient had a delivery of a Viable infant. 04/06/2018  Information for the patient's newborn:  Nobie PutnamGarcia-Lopez, Girl Nou [[id number]][[id number]]  Delivery Method: Vaginal, Spontaneous(Filed from Delivery Summary)    Pateint had an uncomplicated postpartum course.  She is ambulating, tolerating a regular diet, passing flatus, and urinating well. Patient is discharged home in stable condition on 04/08/18.   Physical exam  Vitals:   04/07/18 0135 04/07/18 0600 04/07/18 1828 04/08/18 0553  BP: 106/66 (!) 107/54 111/63 104/73  Pulse: 67 62 (!) 58 62  Resp: 16 16 18 18   Temp: 98 F (36.7 C) 98.6 F (37 C) 98.2 F (36.8 C) 97.9 F (36.6 C)  TempSrc: Oral Oral Oral Oral  SpO2:   100%   Weight:      Height:       General: alert, cooperative and no distress Lochia: appropriate Uterine Fundus: firm Incision: N/A DVT Evaluation: No evidence of DVT seen on physical exam. Negative Homan's sign. Labs: Lab Results  Component Value Date   WBC 9.3 04/06/2018    HGB 12.7 04/06/2018   HCT 36.9 04/06/2018   MCV 83.9 04/06/2018   PLT 318 04/06/2018   No flowsheet data found.  Discharge instruction: per After Visit Summary and "Baby and Me Booklet".  After visit meds:  Allergies as of 04/08/2018   No Known Allergies     Medication List    STOP taking these medications   metroNIDAZOLE 500 MG tablet Commonly known as:  FLAGYL   PRENATAL VITAMIN PO     TAKE these medications   ibuprofen 600 MG tablet Commonly known as:  ADVIL,MOTRIN Take 1 tablet (600 mg total) by mouth every 6 (six) hours.       Diet: routine diet  Activity: Advance as tolerated. Pelvic rest for 6 weeks.   Outpatient follow up:6 weeks Follow up Appt:No future appointments. Follow up Visit:No follow-ups on file.  Postpartum  contraception: Undecided  Newborn Data: Live born female  Birth Weight: 7 lb 3.2 oz (3265 g) APGAR: 8, 9  Newborn Delivery   Birth date/time:  04/06/2018 07:55:00 Delivery type:  Vaginal, Spontaneous     Baby Feeding: Breast Disposition:home with mother   04/08/2018 Myrene Buddy, MD   CNM attestation I have seen and examined this patient and agree with above documentation in the resident's note.   Sarah Miles is a 27 y.o. G1P1001 s/p SVD.   Pain is well controlled.  Plan for birth control is undecided.  Method of Feeding: breast  PE:  BP 104/73 (BP Location: Left Arm)   Pulse 62   Temp 97.9 F (36.6 C) (Oral)   Resp 18   Ht 5' (1.524 m)   Wt 57.6 kg (127 lb)   LMP 07/07/2017 (Exact Date)   SpO2 100%   Breastfeeding? Unknown   BMI 24.80 kg/m  Fundus firm  Recent Labs    04/06/18 0412  HGB 12.7  HCT 36.9     Plan: discharge today - postpartum care discussed - f/u clinic in 6 weeks for postpartum visit   Cam Hai, CNM 9:29 AM 04/08/2018

## 2018-04-08 NOTE — Plan of Care (Signed)
Progressing appropriately. Encouraged to call for assistance with feeding as needed, and for LATCH assessment. 

## 2018-04-08 NOTE — Discharge Instructions (Signed)
Parto vaginal, cuidados posteriores  Vaginal Delivery, Care After  Siga estas instrucciones durante las prximas semanas. Estas indicaciones le proporcionan informacin acerca de cmo deber cuidarse despus del parto vaginal. Su mdico tambin podr darle indicaciones ms especficas. El tratamiento ha sido planificado segn las prcticas mdicas actuales, pero en algunos casos pueden ocurrir problemas. Llame al mdico si tiene problemas o preguntas.  Qu puedo esperar despus del procedimiento?  Despus de un parto vaginal, es frecuente tener lo siguiente:   Hemorragia leve de la vagina.   Dolor en el abdomen, la vagina y la zona de la piel entre la abertura vaginal y el ano (perineo).   Calambres plvicos.   Fatiga.    Siga estas indicaciones en su casa:  Medicamentos   Tome los medicamentos de venta libre y los recetados solamente como se lo haya indicado el mdico.   Si le recetaron un antibitico, tmelo como se lo haya indicado el mdico. No interrumpa la administracin del antibitico hasta que lo haya terminado.  Conducir     No conduzca ni opere maquinaria pesada mientras toma analgsicos recetados.   No conduzca durante 24horas si le administraron un sedante.  Estilo de vida   No beba alcohol. Esto es de suma importancia si est amamantando o toma analgsicos.   No consuma productos que contengan tabaco, incluidos cigarrillos, tabaco de mascar o cigarrillos electrnicos. Si necesita ayuda para dejar de fumar, consulte al mdico.  Qu debe comer y beber   Beba al menos 8vasos de ochoonzas (240cc) de agua todos los das a menos que el mdico le indique lo contrario. Si elige amamantar al beb, quiz deba beber an ms cantidad de agua.   Coma alimentos ricos en fibras todos los das. Estos alimentos pueden ayudarla a prevenir o aliviar el estreimiento. Los alimentos ricos en fibras incluyen, entre otros:  ? Panes y cereales integrales.  ? Arroz integral.  ? Frijoles.  ? Frutas y verduras  frescas.  Actividad   Retome sus actividades normales como se lo haya indicado el mdico. Pregntele al mdico qu actividades son seguras para usted.   Descanse todo lo que pueda. Trate de descansar o tomar una siesta mientras el beb est durmiendo.   No levante objetos que pesen ms que su beb o 10libras (4,5kg) hasta que el mdico le diga que es seguro.   Hable con el mdico sobre cundo puede retomar la actividad sexual. Esto puede depender de lo siguiente:  ? Riesgo de sufrir una infeccin.  ? Velocidad de cicatrizacin.  ? Comodidad y deseo de retomar la actividad sexual.  Cuidados vaginales   Si le realizaron una episiotoma o tuvo un desgarro vaginal, contrlese la zona todos los das para detectar signos de infeccin. Est atenta a los siguientes signos:  ? Aumento del enrojecimiento, la hinchazn o el dolor.  ? Mayor presencia de lquido o sangre.  ? Calor.  ? Pus o mal olor.   No use tampones ni se haga duchas vaginales hasta que el mdico la autorice.   Controle la sangre que elimina por la vagina para detectar cogulos de sangre. Estos pueden tener el aspecto de grumos de color rojo oscuro, o secrecin marrn o negra.  Instrucciones generales   Mantenga el perineo limpio y seco, como se lo haya indicado el mdico.   Use ropa cmoda y suelta.   Cuando vaya al bao, siempre higiencese de adelante hacia atrs.   Pregntele al mdico si puede ducharse o tomar baos de inmersin.   Si se le realiz una episiotoma o tuvo un desgarro perineal durante el trabajo del parto o el parto, es posible que el mdico le indique que no tome baos de inmersin durante un determinado tiempo.   Use un sostn que sujete y ajuste bien sus pechos.   Si es posible, pdale a alguien que la ayude con las tareas del hogar y a cuidar del beb durante al menos algunos das despus de que le den el alta del hospital.   Concurra a todas las visitas de seguimiento para usted y el beb, como se lo haya indicado el  mdico. Esto es importante.  Comunquese con un mdico si:   Tiene los siguientes sntomas:  ? Secrecin vaginal que tiene mal olor.  ? Dificultad para orinar.  ? Dolor al orinar.  ? Aumento o disminucin repentinos de la frecuencia de las deposiciones.  ? Ms enrojecimiento, hinchazn o dolor alrededor de la episiotoma o del desgarro vaginal.  ? Ms secrecin de lquido o sangre de la episiotoma o del desgarro vaginal.  ? Pus o mal olor proveniente de la episiotoma o del desgarro vaginal.  ? Fiebre.  ? Erupcin cutnea.  ? Poco inters o falta de inters en actividades que solan gustarle.  ? Dudas sobre su cuidado y el del beb.   Siente la episiotoma o el desgarro vaginal caliente al tacto.   La episiotoma o el desgarro vaginal se abren o no parecen cicatrizar.   Siente dolor en las mamas, o estn duras o enrojecidas.   Siente tristeza o preocupacin de forma inusual.   Siente nuseas o vomita.   Elimina cogulos de sangre grandes por la vagina. Si expulsa un cogulo de sangre por la vagina, gurdelo para mostrrselo a su mdico. No tire la cadena sin que el mdico examine el cogulo de sangre antes.   Orina ms de lo habitual.   Se siente mareada o se desmaya.   No ha amamantado para nada y no ha tenido un perodo menstrual durante 12 semanas despus del parto.   Dej de amamantar al beb y no ha tenido su perodo menstrual durante 12 semanas despus de dejar de amamantar.  Solicite ayuda de inmediato si:   Tiene los siguientes sntomas:  ? Dolor que no desaparece o no mejora con medicamentos.  ? Dolor en el pecho.  ? Dificultad para respirar.  ? Visin borrosa o manchas en la vista.  ? Pensamientos de autolesionarse o lesionar al beb.   Comienza a sentir dolor en el abdomen o en una de las piernas.   Presenta un dolor de cabeza intenso.   Se desmaya.   Tiene una hemorragia de la vagina tan intensa que empapa dos toallitas sanitarias en una hora.  Esta informacin no tiene como fin  reemplazar el consejo del mdico. Asegrese de hacerle al mdico cualquier pregunta que tenga.  Document Released: 12/09/2005 Document Revised: 04/02/2017 Document Reviewed: 12/24/2015  Elsevier Interactive Patient Education  2018 [employer]vier Inc[employer]

## 2018-04-08 NOTE — Lactation Note (Signed)
This note was copied from a baby's chart. Lactation Consultation Note  Patient Name: Sarah Miles LKGMW'NToday's Date: 04/08/2018    [facility]  G1P1 mother whose infant is now 4250 hours old.  Mother states that breastfeeding has been going well.  Her breasts are feeling fuller this morning.  LC explained that milk is coming in and discussed the difference between coming to volume and engorgement.  She has tenderness to nipples and is using her comfort gels and shells.    Reviewed engorgement prevention/treatment.  FOB present and very supportive.  Appropriate questions asked by both parents and family feels ready for discharge.  Mom made aware of O/P services, breastfeeding support groups, community resources, and our phone # for post-discharge questions.      [name] R Brooks Stotz 04/08/2018, 10:05 AM

## 2018-08-01 IMAGING — US US OB TRANSVAGINAL
1 series · 15 of 28 positions shown · non-contrast
Comparison: None.

CLINICAL DATA: 26-year-old pregnant female presents with spotting.

EDC by LMP: 04/13/2018, projecting to an expected gestational age of
7 weeks 0 days.
EXAM:
OBSTETRIC <14 WK US AND TRANSVAGINAL OB US
TECHNIQUE: Both transabdominal and transvaginal ultrasound examinations were
performed for complete evaluation of the gestation as well as the
maternal uterus, adnexal regions, and pelvic cul-de-sac.
Transvaginal technique was performed to assess early pregnancy.

[Series 1: us ob transvaginal · 15 of 37 slices shown]
[im 1/37]
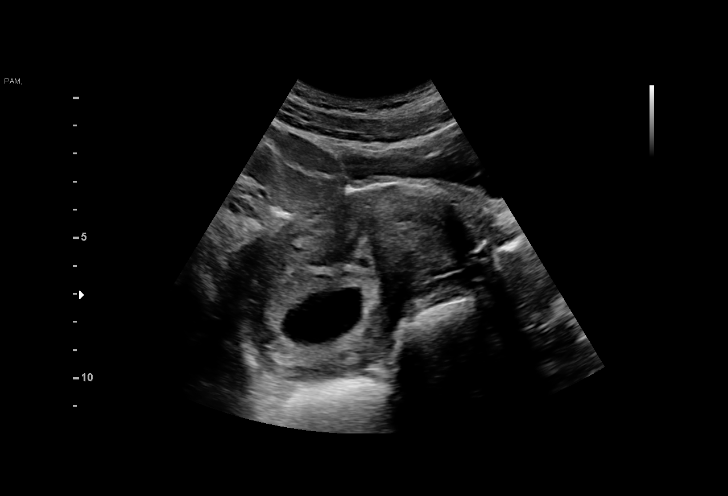
[im 3/37]
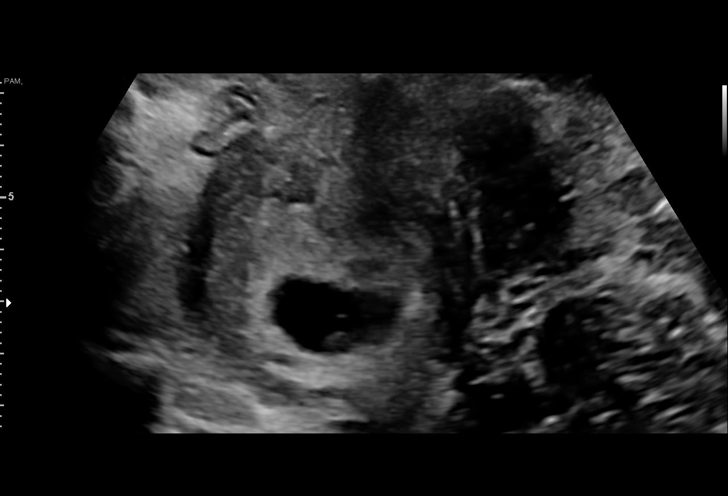
[im 6/37]
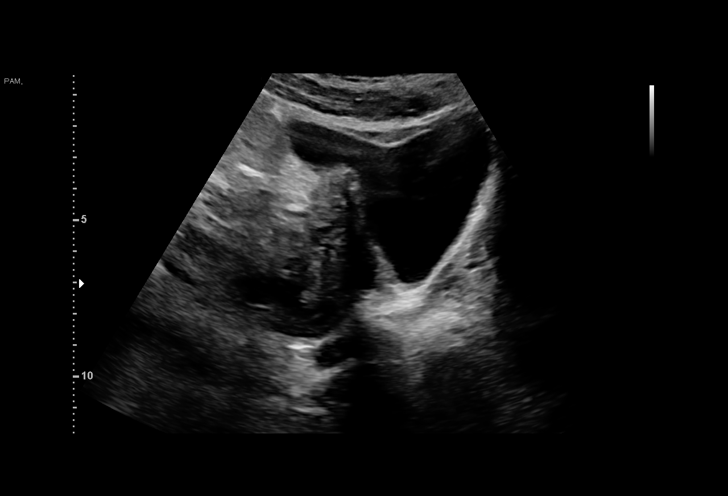
[im 9/37]
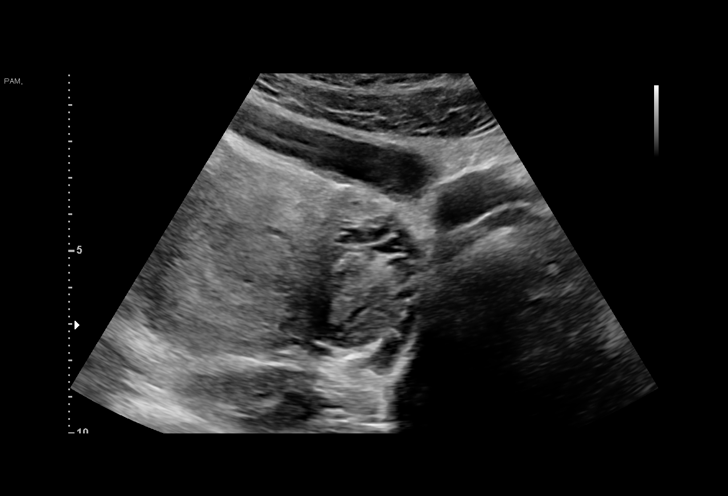
[im 11/37]
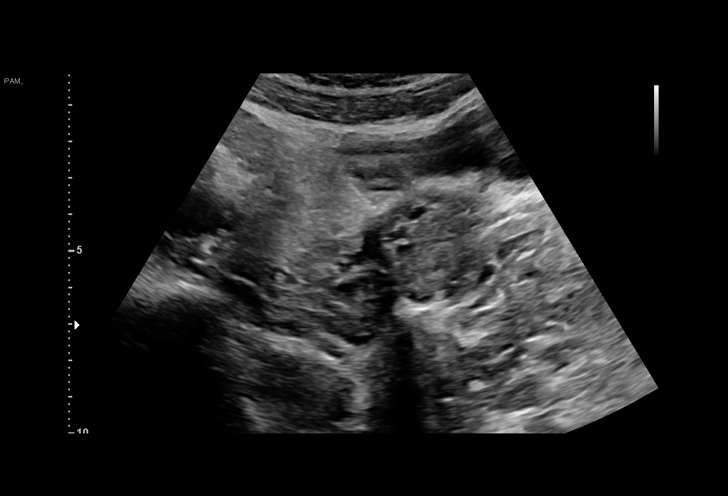
[im 14/37]
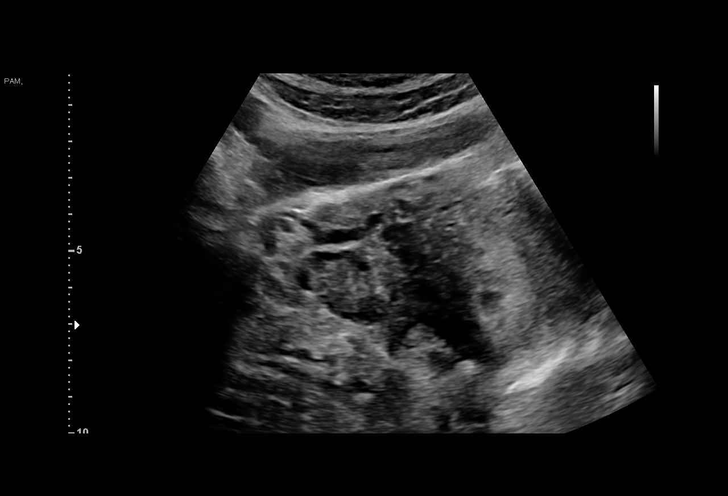
[im 17/37]
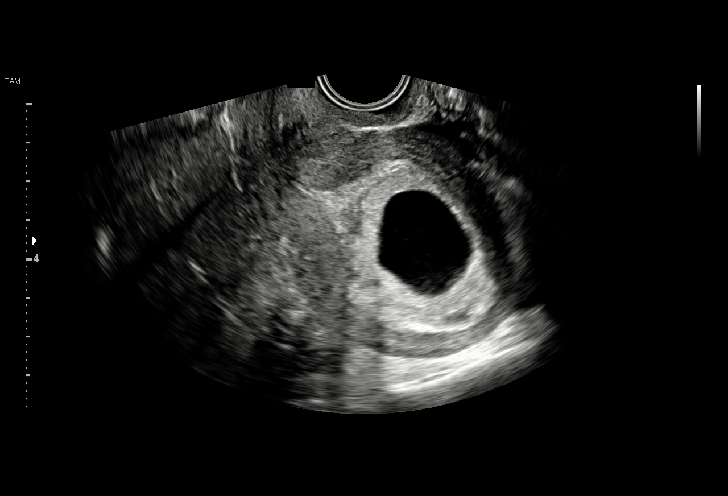
[im 19/37]
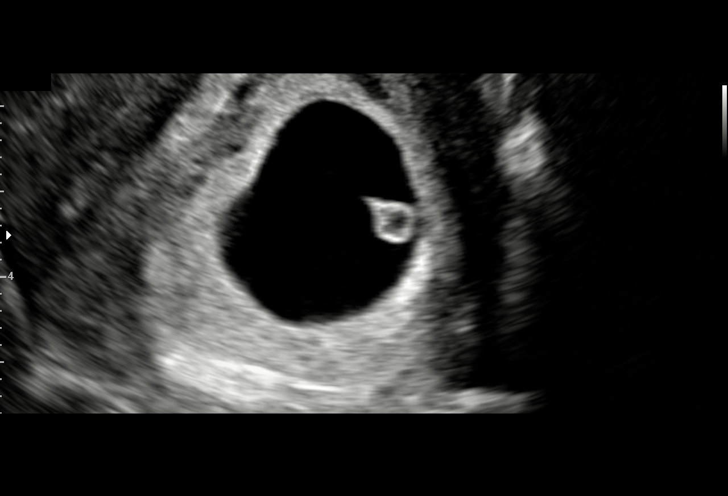
[im 21/37]
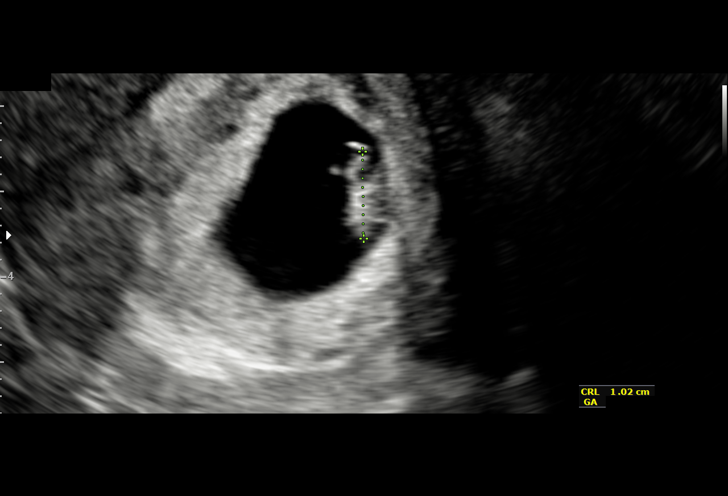
[im 23/37]
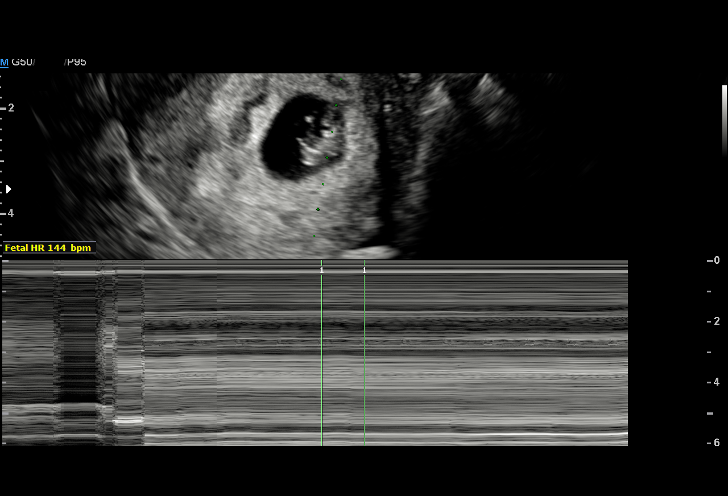
[im 26/37]
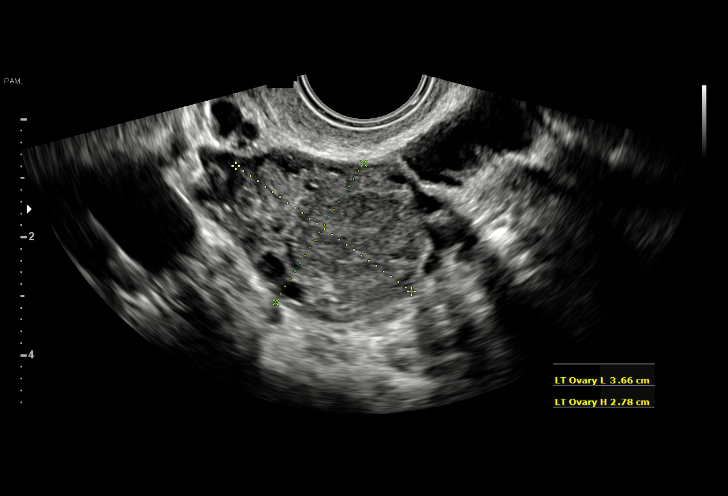
[im 29/37]
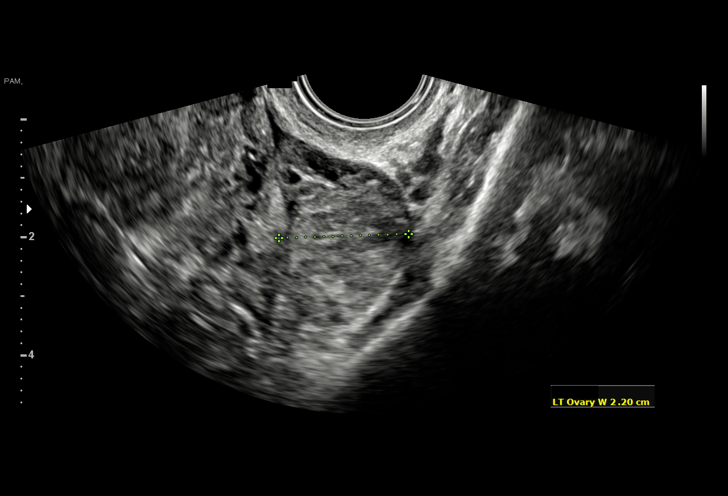
[im 31/37]
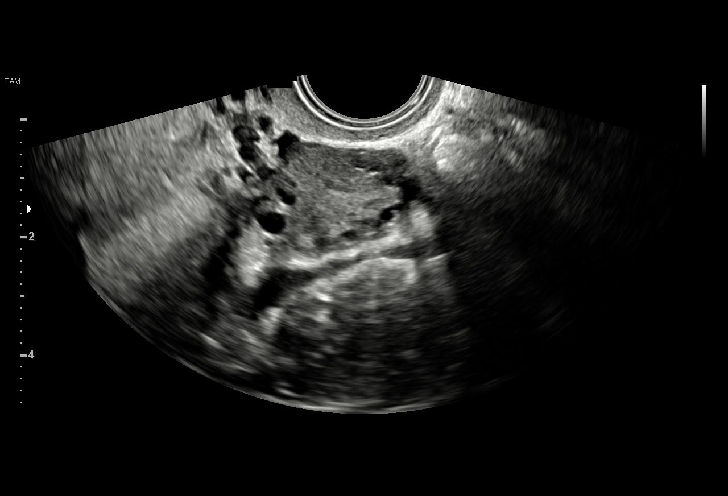
[im 34/37]
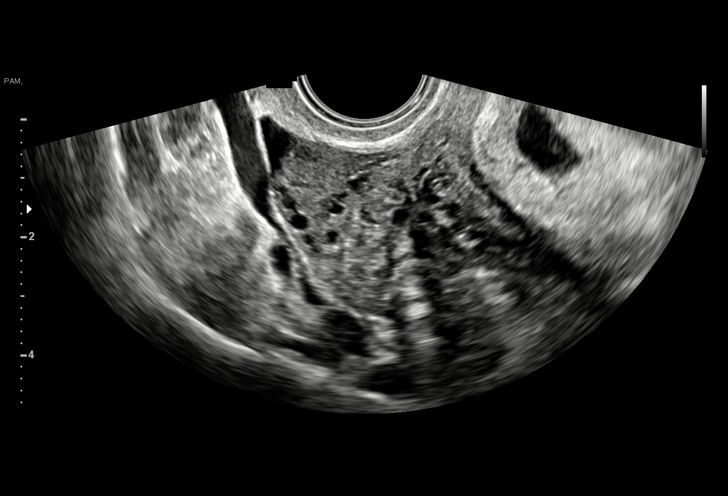
[im 37/37]
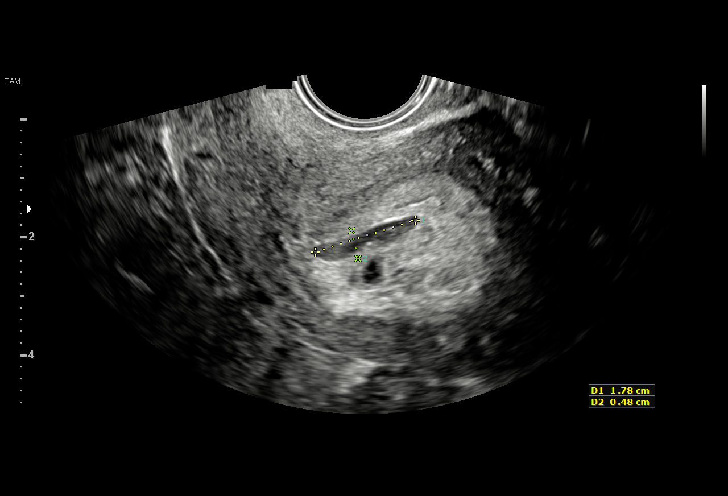

[15 of 28 positions shown; findings below may reference images not displayed]

FINDINGS: Intrauterine gestational sac: Single intrauterine gestational sac is
normal in size, shape and position.

Yolk sac:  Visualized.

Embryo:  Visualized.

Embryonic Cardiac Activity: Regular rate and rhythm.

Embryonic Heart Rate: 144  bpm

CRL:  10.6  mm   7 w   1 d                  US EDC: 04/12/2018

Subchorionic hemorrhage: Small perigestational bleed in the lower
cavity measuring 1.8 x 0.5 cm, involving approximately 30% of the
gestational sac circumference.

Maternal uterus/adnexae: Left ovary measures 3.7 x 2.8 x 2.2 cm and
contains a corpus luteum. Right ovary measures 2.9 x 2.1 x 2.8 cm.
No suspicious ovarian or adnexal masses. No uterine fibroids. No
abnormal free fluid in the pelvis.
IMPRESSION: 1. Single living intrauterine gestation at 7 weeks 1 day by
crown-rump length, concordant with provided menstrual dating .
2. Small perigestational bleed.  Normal embryonic cardiac activity.
3. No ovarian or adnexal abnormality.

## 2019-08-26 ENCOUNTER — Ambulatory Visit: Payer: Self-pay | Admitting: Podiatry

## 2019-08-26 ENCOUNTER — Ambulatory Visit (INDEPENDENT_AMBULATORY_CARE_PROVIDER_SITE_OTHER): Payer: Self-pay | Admitting: Podiatry

## 2019-08-26 ENCOUNTER — Encounter: Payer: Self-pay | Admitting: Podiatry

## 2019-08-26 ENCOUNTER — Other Ambulatory Visit: Payer: Self-pay

## 2019-08-26 DIAGNOSIS — L600 Ingrowing nail: Secondary | ICD-10-CM

## 2019-08-26 MED ORDER — NEOMYCIN-POLYMYXIN-HC 3.5-10000-1 OT SOLN: OTIC | 0 refills | Status: DC

## 2019-08-26 NOTE — Progress Notes (Signed)
   Subjective:    Patient ID: Lenard Galloway, female    DOB: 08-10-1991, 28 y.o.   MRN: [id number]  HPI    Review of Systems  All other systems reviewed and are negative.      Objective:   Physical Exam        Assessment & Plan:

## 2019-08-26 NOTE — Patient Instructions (Addendum)

## 2019-08-27 NOTE — Progress Notes (Signed)
Subjective:   Patient ID: Sarah Miles, female   DOB: 28 y.o.   MRN: [id number]   HPI Patient presents stating she has a very painful ingrown toenail and is been on antibiotics but it remains sore and while it is no longer draining it is hard for her to wear shoe gear comfortably.  Patient does not smoke likes to be active   Review of Systems  All other systems reviewed and are negative.       Objective:  Physical Exam Vitals signs and nursing note reviewed.  Constitutional:      Appearance: She is well-developed.  Pulmonary:     Effort: Pulmonary effort is normal.  Musculoskeletal: Normal range of motion.  Skin:    General: Skin is warm.  Neurological:     Mental Status: She is alert.     Neurovascular status intact muscle strength was found to be adequate range of motion within normal limits.  On the left big toe lateral border there is an incurvated border that is very painful when pressed and is making shoe gear difficult.  There is no redness or drainage associated with it currently and it does have some tissue formation around it secondary to the structural ingrown toenail     Assessment:  Ingrown toenail deformity left hallux lateral border that is painful when pressed with no indication of active infection     Plan:  H&P conditions reviewed and recommended correction.  I explained procedure and risk to translator explained to her and she wants procedure and today I infiltrated the left hallux 60 mg Xylocaine Marcaine mixture I did sterile prep of the toe and with sterile instrumentation remove the lateral border exposed the matrix and applied phenol 3 applications 30 seconds followed by alcohol lavage and sterile dressing.  Gave instructions for soaks and to leave dressing on 24 hours but take it off earlier if any issues should occur and drops were does not prescribed and encouraged to call with any questions concerns

## 2020-11-13 LAB — OB RESULTS CONSOLE HIV ANTIBODY (ROUTINE TESTING): HIV: NONREACTIVE

## 2020-11-13 LAB — OB RESULTS CONSOLE HEPATITIS B SURFACE ANTIGEN: Hepatitis B Surface Ag: NEGATIVE

## 2020-11-13 LAB — OB RESULTS CONSOLE RUBELLA ANTIBODY, IGM: Rubella: IMMUNE

## 2020-11-13 LAB — HEPATITIS C ANTIBODY: HCV Ab: NEGATIVE

## 2020-11-14 ENCOUNTER — Other Ambulatory Visit: Payer: Self-pay | Admitting: Nurse Practitioner

## 2020-11-14 DIAGNOSIS — Z3A13 13 weeks gestation of pregnancy: Secondary | ICD-10-CM

## 2020-11-14 DIAGNOSIS — Z3682 Encounter for antenatal screening for nuchal translucency: Secondary | ICD-10-CM

## 2020-11-20 ENCOUNTER — Inpatient Hospital Stay (HOSPITAL_COMMUNITY)
Admission: AD | Admit: 2020-11-20 | Discharge: 2020-11-20 | Disposition: A | Discharge status: Home / Self Care | Payer: Medicaid Other | Attending: Family Medicine | Admitting: Family Medicine

## 2020-11-20 ENCOUNTER — Encounter (HOSPITAL_COMMUNITY): Payer: Self-pay | Admitting: Family Medicine

## 2020-11-20 ENCOUNTER — Other Ambulatory Visit: Payer: Self-pay

## 2020-11-20 DIAGNOSIS — O99281 Endocrine, nutritional and metabolic diseases complicating pregnancy, first trimester: Secondary | ICD-10-CM

## 2020-11-20 DIAGNOSIS — G44209 Tension-type headache, unspecified, not intractable: Secondary | ICD-10-CM | POA: Insufficient documentation

## 2020-11-20 DIAGNOSIS — O99891 Other specified diseases and conditions complicating pregnancy: Secondary | ICD-10-CM

## 2020-11-20 DIAGNOSIS — R55 Syncope and collapse: Secondary | ICD-10-CM | POA: Diagnosis not present

## 2020-11-20 DIAGNOSIS — Y929 Unspecified place or not applicable: Secondary | ICD-10-CM | POA: Insufficient documentation

## 2020-11-20 DIAGNOSIS — Z3A12 12 weeks gestation of pregnancy: Secondary | ICD-10-CM | POA: Diagnosis not present

## 2020-11-20 DIAGNOSIS — W2209XA Striking against other stationary object, initial encounter: Secondary | ICD-10-CM | POA: Diagnosis not present

## 2020-11-20 DIAGNOSIS — R42 Dizziness and giddiness: Secondary | ICD-10-CM | POA: Diagnosis not present

## 2020-11-20 DIAGNOSIS — Y939 Activity, unspecified: Secondary | ICD-10-CM | POA: Insufficient documentation

## 2020-11-20 DIAGNOSIS — E860 Dehydration: Secondary | ICD-10-CM | POA: Diagnosis not present

## 2020-11-20 DIAGNOSIS — Y999 Unspecified external cause status: Secondary | ICD-10-CM | POA: Insufficient documentation

## 2020-11-20 DIAGNOSIS — H538 Other visual disturbances: Secondary | ICD-10-CM | POA: Diagnosis not present

## 2020-11-20 DIAGNOSIS — O26891 Other specified pregnancy related conditions, first trimester: Secondary | ICD-10-CM | POA: Diagnosis not present

## 2020-11-20 DIAGNOSIS — R109 Unspecified abdominal pain: Secondary | ICD-10-CM

## 2020-11-20 DIAGNOSIS — O99351 Diseases of the nervous system complicating pregnancy, first trimester: Secondary | ICD-10-CM

## 2020-11-20 DIAGNOSIS — W19XXXA Unspecified fall, initial encounter: Secondary | ICD-10-CM

## 2020-11-20 LAB — BASIC METABOLIC PANEL
Anion gap: 8 (ref 5–15)
BUN: 7 mg/dL (ref 6–20)
CO2: 21 mmol/L — ABNORMAL LOW (ref 22–32)
Calcium: 8.7 mg/dL — ABNORMAL LOW (ref 8.9–10.3)
Chloride: 106 mmol/L (ref 98–111)
Creatinine, Ser: 0.6 mg/dL (ref 0.44–1.00)
GFR, Estimated: >60 mL/min (ref >60)
Glucose, Bld: 99 mg/dL (ref 70–99)
Potassium: 3.4 mmol/L — ABNORMAL LOW (ref 3.5–5.1)
Sodium: 135 mmol/L (ref 135–145)

## 2020-11-20 LAB — URINALYSIS, ROUTINE W REFLEX MICROSCOPIC
Bilirubin Urine: NEGATIVE
Glucose, UA: NEGATIVE mg/dL
Hgb urine dipstick: NEGATIVE
Ketones, ur: NEGATIVE mg/dL
Leukocytes,Ua: NEGATIVE
Nitrite: NEGATIVE
Protein, ur: 100 mg/dL — AB
Specific Gravity, Urine: 1.017 (ref 1.005–1.030)
pH: 9 — ABNORMAL HIGH (ref 5.0–8.0)

## 2020-11-20 LAB — CBC
HCT: 37 % (ref 36.0–46.0)
Hemoglobin: 12.7 g/dL (ref 12.0–15.0)
MCH: 29.7 pg (ref 26.0–34.0)
MCHC: 34.3 g/dL (ref 30.0–36.0)
MCV: 86.4 fL (ref 80.0–100.0)
Platelets: 299 10*3/uL (ref 150–400)
RBC: 4.28 MIL/uL (ref 3.87–5.11)
RDW: 13 % (ref 11.5–15.5)
WBC: 13.3 10*3/uL — ABNORMAL HIGH (ref 4.0–10.5)
nRBC: 0 % (ref 0.0–0.2)

## 2020-11-20 LAB — WET PREP, GENITAL
Clue Cells Wet Prep HPF POC: NONE SEEN
Sperm: NONE SEEN
Trich, Wet Prep: NONE SEEN

## 2020-11-20 MED ORDER — BUTALBITAL-APAP-CAFFEINE 50-325-40 MG PO TABS
1.0000 | ORAL_TABLET | Freq: Four times a day (QID) | ORAL | Status: DC | PRN
  Administered 2020-11-20: 1 via ORAL
  Filled 2020-11-20: qty 1

## 2020-11-20 NOTE — Discharge Instructions (Signed)
Dolor abdominal en los adultos Abdominal Pain, Adult El dolor de [place] (abdominal) puede tener muchas causas. La mayora de las veces, el dolor de [place] no es peligroso. Muchos de [employer] de dolor de estmago pueden controlarse y tratarse en casa. Sin embargo, a Occupational psychologist, Chief Technology Officer de [place] es grave. El mdico intentar descubrir la causa del dolor de [place]. Siga estas instrucciones en su casa:  Medicamentos  [employer] de venta libre y los recetados solamente como se lo haya indicado el mdico.  No tome medicamentos que lo ayuden a Advertising copywriter (laxantes), salvo que el mdico se lo indique. Instrucciones generales  Est atento al dolor de estmago para Insurance risk surveyor cambio.  Beba suficiente lquido para Radio producer pis (la orina) de color amarillo plido.  Concurra a todas las visitas de [address] se lo haya indicado el mdico. Esto es importante. Comunquese con un mdico si:  El dolor de [address] cambia o [place].  No tiene apetito o baja de peso sin proponrselo.  Tiene dificultades para defecar (est estreido) o heces lquidas (diarrea) durante ms de 2 o 3das.  Siente dolor al orinar o defecar.  El dolor de estmago lo despierta de noche.  El dolor empeora con las comidas, despus de comer o con determinados alimentos.  Tiene vmitos y no puede retener nada de lo que ingiere.  Tiene fiebre.  Observa sangre en la orina. Solicite ayuda de inmediato si:  El dolor no desaparece en el tiempo indicado por el mdico.  No puede dejar de vomitar.  Siente dolor solamente en zonas especficas del abdomen, como el lado derecho o la parte inferior izquierda.  Tiene heces con sangre, de color negro o con aspecto alquitranado.  Tiene dolor muy intenso en el vientre, clicos o meteorismo.  Presenta signos de no tener suficientes lquidos o agua en el cuerpo (deshidratacin), por ejemplo: ? [place], muy escasa o falta de orina. ? Labios  agrietados. ? Sequedad de boca. ? Ojos hundidos. ? Somnolencia. ? Debilidad.  Tiene dificultad para respirar o Journalist, newspaper. Resumen  Muchos de [employer] de dolor de estmago pueden controlarse y tratarse en casa.  Est atento al dolor de estmago para Insurance risk surveyor cambio.  Tome los medicamentos de venta libre y los recetados solamente como se lo haya indicado el mdico.  Comunquese con un mdico si el dolor de estmago cambia o [place].  Busque ayuda de inmediato si tiene dolor muy intenso en el vientre, clicos o meteorismo. Esta informacin no tiene Theme park manager el consejo del mdico. Asegrese de hacerle al mdico cualquier pregunta que tenga. Document Revised: 06/16/2019 Document Reviewed: 06/16/2019 Elsevier Patient Education  2020 Elsevier Inc.   Preventing Injuries During Pregnancy  Injuries can happen during pregnancy. Minor falls and accidents usually do not harm you or your baby. But some injuries can harm you and your baby. Tell your doctor about any injury you suffer. What can I do to avoid injuries? Safety  Remove rugs and loose objects on the floor.  Wear comfortable shoes that have a good grip. Do not wear shoes that have high heels.  Always wear your seat belt in the car. The lap belt should be below your belly. Always drive safely.  Do not ride on a motorcycle. Activity  Do not take part in rough and violent activities or sports.  Avoid: ? Walking on wet or slippery floors. ? Lifting heavy pots of boiling or hot liquids. ? Fixing electrical problems. ?  Being near fires. General instructions  Take over-the-counter and prescription medicines only as told by your doctor.  Know your blood type and the blood type of the baby's father.  If you are a victim of domestic violence: ? Call your local emergency services (911 in the [place] ? Contact the [employer] Violence Hotline for help and support. Get help right away  if:  You fall on your belly or receive any serious blow to your belly.  You have a stiff neck or neck pain after a fall or an injury.  You get a headache or have problems with vision after an injury.  You do not feel the baby move or the baby is not moving as much as normal.  You have been a victim of domestic violence or any other kind of attack.  You have been in a car accident.  You have bleeding from your vagina.  Fluid is leaking from your vagina.  You start to have cramping or pain in your belly (contractions).  You have very bad pain in your lower back.  You feel weak or pass out (faint).  You start to throw up (vomit) after an injury.  You have been burned. Summary  Some injuries that happen during pregnancy can do harm to the baby.  Tell your doctor about any injury.  Take steps to avoid injury. This includes removing rugs and loose objects on the floor. Always wear your seat belt in the car.  Do not take part in rough and violent activities or sports.  Get help right away if you have any serious accident or injury. This information is not intended to replace advice given to you by your health care provider. Make sure you discuss any questions you have with your health care provider. Document Revised: 09/03/2019 Document Reviewed: 12/18/2016 Elsevier Patient Education  2020 Elsevier Inc.   Syncope Syncope is when you pass out (faint) for a short time. It is caused by a sudden decrease in blood flow to the brain. Signs that you may be about to pass out include:  Feeling dizzy or light-headed.  Feeling sick to your stomach (nauseous).  Seeing all white or all black.  Having cold, clammy skin. If you pass out, get help right away. Call your local emergency services (911 in the [place] Do not drive yourself to the hospital. Follow these instructions at home: Watch for any changes in your symptoms. Take these actions to stay safe and help with your  symptoms: Lifestyle  Do not drive, use machinery, or play sports until your doctor says it is okay.  Do not drink alcohol.  Do not use any products that contain nicotine or tobacco, such as cigarettes and e-cigarettes. If you need help quitting, ask your doctor.  Drink enough fluid to keep your pee (urine) pale yellow. General instructions  Take over-the-counter and prescription medicines only as told by your doctor.  If you are taking blood pressure or heart medicine, sit up and stand up slowly. Spend a few minutes getting ready to sit and then stand. This can help you feel less dizzy.  Have someone stay with you until you feel stable.  If you start to feel like you might pass out, lie down right away and raise (elevate) your feet above the level of your heart. Breathe deeply and steadily. Wait until all of the symptoms are gone.  Keep all follow-up visits as told by your doctor. This is important. Get help right away if:  You have a very bad headache.  You pass out once or more than once.  You have pain in your chest, belly, or back.  You have a very fast or uneven heartbeat (palpitations).  It hurts to breathe.  You are bleeding from your mouth or your bottom (rectum).  You have black or tarry poop (stool).  You have jerky movements that you cannot control (seizure).  You are confused.  You have trouble walking.  You are very weak.  You have vision problems. These symptoms may be an emergency. Do not wait to see if the symptoms will go away. Get medical help right away. Call your local emergency services (911 in the [place] Do not drive yourself to the hospital. Summary  Syncope is when you pass out (faint) for a short time. It is caused by a sudden decrease in blood flow to the brain.  Signs that you may be about to faint include feeling dizzy, light-headed, or sick to your stomach, seeing all white or all black, or having cold, clammy skin.  If you start to  feel like you might pass out, lie down right away and raise (elevate) your feet above the level of your heart. Breathe deeply and steadily. Wait until all of the symptoms are gone. This information is not intended to replace advice given to you by your health care provider. Make sure you discuss any questions you have with your health care provider. Document Revised: 01/21/2018 Document Reviewed: 01/21/2018 Elsevier Patient Education  2020 [employer].

## 2020-11-20 NOTE — MAU Provider Note (Signed)
History     CSN: [id number]  Arrival date and time: 11/20/20 1031   First Provider Initiated Contact with Patient 11/20/20 1105      Chief Complaint  Patient presents with  . Dizziness  . Fall  . Headache  . Abdominal Pain   29 y.o. G2P1001 @12 .3 wks presenting with HA and syncope. Reports onset of dizziness then syncope around 0930. She thinks she hit the right side of her abdomen on the counter top. When she came to she was sitting on the floor. States the HA started when she felt dizzy. Located temporal and rates 10/10. Had blurry vision at the time of the event. Also having LAP that started on the RLQ and now located LLQ. Denies VB. States she had cereal and tea prior to the incident.    OB History    Gravida  2   Para  1   Term  1   Preterm      AB      Living  1     SAB      TAB      Ectopic      Multiple  0   Live Births  1           Past Medical History:  Diagnosis Date  . Medical history non-contributory     Past Surgical History:  Procedure Laterality Date  . APPENDECTOMY      Family History  Problem Relation Age of Onset  . Diabetes Maternal Aunt   . Diabetes Paternal Grandfather     Social History   Tobacco Use  . Smoking status: Never Smoker  . Smokeless tobacco: Never Used  Substance Use Topics  . Alcohol use: No  . Drug use: No    Allergies: No Known Allergies  No medications prior to admission.    Review of Systems  Eyes: Positive for visual disturbance.  Gastrointestinal: Positive for abdominal pain. Negative for constipation and diarrhea.  Genitourinary: Negative for vaginal bleeding.  Neurological: Positive for dizziness, syncope and headaches.   Physical Exam   Blood pressure 105/60, pulse 82, temperature 98 F (36.7 C), resp. rate 16, unknown if currently breastfeeding. Orthostatic VS for the past 24 hrs:  BP- Lying BP- Sitting BP- Standing at 0 minutes  11/20/20 1056 -- -- 100/56  11/20/20 1055 --  90/65 --  11/20/20 1052 107/65 -- --   Physical Exam Vitals and nursing note reviewed. Exam conducted with a chaperone present.  Constitutional:      General: She is not in acute distress.    Appearance: Normal appearance.  HENT:     Head: Normocephalic and atraumatic.  Cardiovascular:     Rate and Rhythm: Normal rate and regular rhythm.  Pulmonary:     Effort: Pulmonary effort is normal. No respiratory distress.     Breath sounds: Normal breath sounds. No stridor. No wheezing, rhonchi or rales.  Abdominal:     General: There is no distension.     Palpations: Abdomen is soft.     Tenderness: There is no abdominal tenderness. There is no guarding or rebound.  Genitourinary:    Comments: VE: closed/long, no blood Musculoskeletal:        General: Normal range of motion.     Cervical back: Normal range of motion.  Skin:    General: Skin is warm and dry.  Neurological:     General: No focal deficit present.     Mental Status: She is alert and  oriented to person, place, and time.  Psychiatric:        Mood and Affect: Mood normal.        Behavior: Behavior normal.   FHT 155  Results for orders placed or performed during the hospital encounter of 11/20/20 (from the past 24 hour(s))  Urinalysis, Routine w reflex microscopic     Status: Abnormal   Collection Time: 11/20/20 11:02 AM  Result Value Ref Range   Color, Urine YELLOW YELLOW   APPearance CLOUDY (A) CLEAR   Specific Gravity, Urine 1.017 1.005 - 1.030   pH 9.0 (H) 5.0 - 8.0   Glucose, UA NEGATIVE NEGATIVE mg/dL   Hgb urine dipstick NEGATIVE NEGATIVE   Bilirubin Urine NEGATIVE NEGATIVE   Ketones, ur NEGATIVE NEGATIVE mg/dL   Protein, ur 132 (A) NEGATIVE mg/dL   Nitrite NEGATIVE NEGATIVE   Leukocytes,Ua NEGATIVE NEGATIVE   RBC / HPF 0-5 0 - 5 RBC/hpf   WBC, UA 0-5 0 - 5 WBC/hpf   Bacteria, UA RARE (A) NONE SEEN   Squamous Epithelial / LPF 0-5 0 - 5   Mucus PRESENT    Amorphous Crystal PRESENT   Wet prep, genital      Status: Abnormal   Collection Time: 11/20/20 11:14 AM  Result Value Ref Range   Yeast Wet Prep HPF POC PRESENT (A) NONE SEEN   Trich, Wet Prep NONE SEEN NONE SEEN   Clue Cells Wet Prep HPF POC NONE SEEN NONE SEEN   WBC, Wet Prep HPF POC MANY (A) NONE SEEN   Sperm NONE SEEN   CBC     Status: Abnormal   Collection Time: 11/20/20 11:39 AM  Result Value Ref Range   WBC 13.3 (H) 4.0 - 10.5 K/uL   RBC 4.28 3.87 - 5.11 MIL/uL   Hemoglobin 12.7 12.0 - 15.0 g/dL   HCT 44.0 36 - 46 %   MCV 86.4 80.0 - 100.0 fL   MCH 29.7 26.0 - 34.0 pg   MCHC 34.3 30.0 - 36.0 g/dL   RDW 10.2 72.5 - 36.6 %   Platelets 299 150 - 400 K/uL   nRBC 0.0 0.0 - 0.2 %  Basic metabolic panel     Status: Abnormal   Collection Time: 11/20/20 11:39 AM  Result Value Ref Range   Sodium 135 135 - 145 mmol/L   Potassium 3.4 (L) 3.5 - 5.1 mmol/L   Chloride 106 98 - 111 mmol/L   CO2 21 (L) 22 - 32 mmol/L   Glucose, Bld 99 70 - 99 mg/dL   BUN 7 6 - 20 mg/dL   Creatinine, Ser 4.40 0.44 - 1.00 mg/dL   Calcium 8.7 (L) 8.9 - 10.3 mg/dL   GFR, Estimated >34 >74 mL/min   Anion gap 8 5 - 15   MAU Course  Procedures  MDM Labs and EKG ordered and reviewed. EKG read by Dr. Bradd Canary. HA and pain improved. Recommend frequent meals and snacks, and increase hydration. Stable for discharge home.  Assessment and Plan   1. [redacted] weeks gestation of pregnancy   2. Acute non intractable tension-type headache   3. Dehydration   4. Fall, initial encounter   5. Abdominal pain during pregnancy in first trimester   6. Syncope, unspecified syncope type    Discharge home Follow up at [facility] next week as scheduled Tylenol/heating pad prn SAB precautions  Allergies as of 11/20/2020   No Known Allergies     Medication List    STOP taking these medications  cephALEXin 500 MG capsule Commonly known as: KEFLEX   ibuprofen 600 MG tablet Commonly known as: ADVIL   naproxen 500 MG tablet Commonly known as: NAPROSYN    neomycin-polymyxin-hydrocortisone OTIC solution Commonly known as: CORTISPORIN      Interpreter present for all interactions  Donette Larry, CNM 11/20/2020, 1:27 PM

## 2020-11-20 NOTE — MAU Note (Signed)
.   Sarah Miles is a 29 y.o. at [redacted]w[redacted]d here in MAU reporting: she fainted at home this morning. Pt reports that she has lower abdominal cramping and a headache LMP: 0*/02/2020 Onset of complaint: today Pain score: 8/10 abd  10/10 head Vitals:   11/20/20 1052  BP: 107/65  Pulse: 94  Resp: 16  Temp: 98 F (36.7 C)     FHT: Lab orders placed from triage: UA

## 2020-11-21 LAB — GC/CHLAMYDIA PROBE AMP (~~LOC~~) NOT AT ARMC
Chlamydia: NEGATIVE
Comment: NEGATIVE
Comment: NORMAL
Neisseria Gonorrhea: NEGATIVE

## 2020-11-27 ENCOUNTER — Ambulatory Visit: Payer: Medicaid Other | Admitting: *Deleted

## 2020-11-27 ENCOUNTER — Other Ambulatory Visit: Payer: Self-pay

## 2020-11-27 ENCOUNTER — Encounter: Payer: Self-pay | Admitting: *Deleted

## 2020-11-27 ENCOUNTER — Ambulatory Visit: Payer: Medicaid Other | Attending: Nurse Practitioner

## 2020-11-27 ENCOUNTER — Ambulatory Visit: Payer: Medicaid Other

## 2020-11-27 VITALS — BP 104/67 | HR 80 | Wt 136.0 lb

## 2020-11-27 DIAGNOSIS — Z3A13 13 weeks gestation of pregnancy: Secondary | ICD-10-CM | POA: Insufficient documentation

## 2020-11-27 DIAGNOSIS — Z3682 Encounter for antenatal screening for nuchal translucency: Secondary | ICD-10-CM | POA: Insufficient documentation

## 2020-11-27 DIAGNOSIS — Z369 Encounter for antenatal screening, unspecified: Secondary | ICD-10-CM

## 2020-12-05 LAB — FIRST TRIMESTER SCREEN W/NT
CRL: 78.6 mm
DIA MoM: 0.83
DIA Value: 176.5 pg/mL
Gest Age-Collect: 13.6 weeks
Maternal Age At EDD: 31 yr
Nuchal Translucency MoM: 0.94
Nuchal Translucency: 1.6 mm
Number of Fetuses: 1
PAPP-A MoM: 0.82
PAPP-A Value: 1397.5 ng/mL
Sonographer ID#: 28462
Test Results:: NEGATIVE
Weight: 135 [lb_av]
hCG MoM: 0.68
hCG Value: 57.3 IU/mL

## 2020-12-06 ENCOUNTER — Telehealth: Payer: Self-pay | Admitting: Genetic Counselor

## 2020-12-06 NOTE — Telephone Encounter (Signed)
Called Ms. Talbot with the help of [address] [place], [place] [phone] to discuss her first trimester screening results. Informed Ms. Glanz that I was calling with good news about her screening results and requested a call back to my direct line to discuss these in more detail, as no identifiers were provided in voicemail message.   Gershon Crane, MS, [facility] Genetic Counselor

## 2020-12-08 ENCOUNTER — Telehealth: Payer: Self-pay | Admitting: Genetic Counselor

## 2020-12-08 NOTE — Telephone Encounter (Signed)
Attempted to call Ms. Fanara mobile number with the help of a [address], ID# [phone] regarding her negative first trimester screening results. However, her voicemail was not set up so we could not leave a message. Faxed a copy of her results to her referring provider.  Gershon Crane, MS, [facility] Genetic Counselor

## 2020-12-23 NOTE — L&D Delivery Note (Addendum)
Delivery Note Sarah Miles is a 30 y.o. G2P1001 at [redacted]w[redacted]d admitted for SOL in active labor.   GBS Status: negative   Maximum Maternal Temperature: 98.82F  Labor course: Initial SVE: 7/90/0. Augmentation with: N/A. She then progressed to complete.  ROM: 0h 44m with clear fluid  Birth: At 0605 a viable female was delivered via spontaneous vaginal delivery (Presentation: OA  ). Nuchal cord present: No.  Shoulders and body delivered in usual fashion. Infant placed directly on mom's abdomen for bonding/skin-to-skin, baby dried and stimulated. Cord clamped x 2 after 1 minute and cut by FOB.  Cord blood collected.  The placenta separated spontaneously and delivered via gentle cord traction.  Pitocin infused rapidly IV per protocol.  Fundus firm with massage.  Placenta inspected and appears to be intact with a 3 VC.  Placenta/Cord with the following complications: none .  Cord pH: n/a Sponge and instrument count were correct x2.  Intrapartum complications:  None Anesthesia:  none Episiotomy: none Lacerations:  1st degree perineal, hemostatic Suture Repair: not required EBL (mL): 50   Infant: APGAR (1 MIN): 9   APGAR (5 MINS): 9   APGAR (10 MINS):    Infant weight: pending  Mom to postpartum.  Baby to Couplet care / Skin to Skin. Placenta to L&D   Plans to Breast and bottlefeed Contraception: NFP/condoms Circumcision: N/A  Note sent to [facility]: N/A, [facility] pt for pp visit.  Littie Deeds, MD 05/20/2021 6:44 AM    Attestation:  I confirm that I have verified the information documented in the resident's note and that I have also personally reperformed the physical exam and all medical decision making activities.   I was gloved and present for entire delivery SVD without incident No difficulty with shoulders Lacerations as listed above  Aviva Signs, CNM

## 2021-04-12 ENCOUNTER — Other Ambulatory Visit: Payer: Self-pay | Admitting: Family

## 2021-04-12 DIAGNOSIS — Z363 Encounter for antenatal screening for malformations: Secondary | ICD-10-CM

## 2021-04-12 DIAGNOSIS — Z3A33 33 weeks gestation of pregnancy: Secondary | ICD-10-CM

## 2021-04-12 DIAGNOSIS — O283 Abnormal ultrasonic finding on antenatal screening of mother: Secondary | ICD-10-CM

## 2021-04-13 ENCOUNTER — Other Ambulatory Visit: Payer: Self-pay

## 2021-04-13 ENCOUNTER — Encounter: Payer: Self-pay | Admitting: *Deleted

## 2021-04-13 ENCOUNTER — Ambulatory Visit: Payer: Self-pay | Admitting: *Deleted

## 2021-04-13 ENCOUNTER — Ambulatory Visit: Payer: Self-pay | Attending: Family

## 2021-04-13 VITALS — BP 103/61 | HR 76

## 2021-04-13 DIAGNOSIS — Z363 Encounter for antenatal screening for malformations: Secondary | ICD-10-CM | POA: Insufficient documentation

## 2021-04-13 DIAGNOSIS — O283 Abnormal ultrasonic finding on antenatal screening of mother: Secondary | ICD-10-CM | POA: Insufficient documentation

## 2021-04-13 DIAGNOSIS — O289 Unspecified abnormal findings on antenatal screening of mother: Secondary | ICD-10-CM

## 2021-04-13 DIAGNOSIS — Z3A33 33 weeks gestation of pregnancy: Secondary | ICD-10-CM | POA: Insufficient documentation

## 2021-04-13 NOTE — Progress Notes (Signed)
C/o"vaginal pressure" 

## 2021-04-13 NOTE — Progress Notes (Unsigned)
MFM brief consult  Sarah Miles is a 30 yo G2P1 who is here at 33 weeks with good dates.  A Single intrauterine pregnancy here for a detailed anatomy due to suspected renal pyelecatsis of 9 mm on the left and 5 mm on the right.  Normal anatomy with measurements consistent with dates There is good fetal movement and amniotic fluid volume  Suboptimal views of the fetal anatomy were obtained secondary to fetal position and advanced gestational age.  I discussed with Sarah Miles I discussed with normal bilateral renal pelvis measurements. Her prior exam for the right kidney was also normal given the that the AP diameter was <  7 mm.  I discussed the etiology of renal pyelectasis to include normal variant, ureteropelvic or vesicle junction obstruction and urethrovesicle reflux.  Prior to 28 weeks the threshold for abnormal is <31mm but after 28 weeks >7 mm. At times ultrasound meaurements can appear large as the kidney's fill. However, measurements should be taken at their lowest point in the transverse spine up position.  I explained that not further evaluation is warranted at this time.   All questions answered.  I spent 15 minutes with > 50% in face to face consultation  Montrae Braithwaite J. Grace Bushy.

## 2021-05-20 ENCOUNTER — Other Ambulatory Visit: Payer: Self-pay

## 2021-05-20 ENCOUNTER — Encounter (HOSPITAL_COMMUNITY): Payer: Self-pay | Admitting: Obstetrics and Gynecology

## 2021-05-20 ENCOUNTER — Inpatient Hospital Stay (HOSPITAL_COMMUNITY)
Admission: AD | Admit: 2021-05-20 | Discharge: 2021-05-21 | DRG: 805 | Disposition: A | Discharge status: Home / Self Care | Payer: Medicaid Other | Attending: Family Medicine | Admitting: Family Medicine

## 2021-05-20 DIAGNOSIS — U071 COVID-19: Secondary | ICD-10-CM | POA: Diagnosis present

## 2021-05-20 DIAGNOSIS — O9852 Other viral diseases complicating childbirth: Principal | ICD-10-CM | POA: Diagnosis present

## 2021-05-20 DIAGNOSIS — Z3A38 38 weeks gestation of pregnancy: Secondary | ICD-10-CM

## 2021-05-20 DIAGNOSIS — O26893 Other specified pregnancy related conditions, third trimester: Secondary | ICD-10-CM | POA: Diagnosis present

## 2021-05-20 DIAGNOSIS — O479 False labor, unspecified: Secondary | ICD-10-CM

## 2021-05-20 LAB — RESP PANEL BY RT-PCR (FLU A&B, COVID) ARPGX2
Influenza A by PCR: NEGATIVE
Influenza B by PCR: NEGATIVE
SARS Coronavirus 2 by RT PCR: POSITIVE — AB

## 2021-05-20 LAB — CBC
HCT: 39.7 % (ref 36.0–46.0)
Hemoglobin: 13.5 g/dL (ref 12.0–15.0)
MCH: 30.3 pg (ref 26.0–34.0)
MCHC: 34 g/dL (ref 30.0–36.0)
MCV: 89 fL (ref 80.0–100.0)
Platelets: 261 10*3/uL (ref 150–400)
RBC: 4.46 MIL/uL (ref 3.87–5.11)
RDW: 12.9 % (ref 11.5–15.5)
WBC: 8.7 10*3/uL (ref 4.0–10.5)
nRBC: 0 % (ref 0.0–0.2)

## 2021-05-20 LAB — RPR: RPR Ser Ql: NONREACTIVE

## 2021-05-20 LAB — TYPE AND SCREEN
ABO/RH(D): O POS
Antibody Screen: NEGATIVE

## 2021-05-20 MED ORDER — ONDANSETRON HCL 4 MG/2ML IJ SOLN: 4.0000 mg | INTRAMUSCULAR | Status: DC | PRN

## 2021-05-20 MED ORDER — DIBUCAINE (PERIANAL) 1 % EX OINT: 1.0000 "application " | TOPICAL_OINTMENT | CUTANEOUS | Status: DC | PRN

## 2021-05-20 MED ORDER — SENNOSIDES-DOCUSATE SODIUM 8.6-50 MG PO TABS: 2.0000 | ORAL_TABLET | Freq: Every day | ORAL | Status: DC

## 2021-05-20 MED ORDER — EPHEDRINE 5 MG/ML INJ: 10.0000 mg | INTRAVENOUS | Status: DC | PRN

## 2021-05-20 MED ORDER — OXYCODONE HCL 5 MG PO TABS: 5.0000 mg | ORAL_TABLET | ORAL | Status: DC | PRN

## 2021-05-20 MED ORDER — TETANUS-DIPHTH-ACELL PERTUSSIS 5-2.5-18.5 LF-MCG/0.5 IM SUSY: 0.5000 mL | PREFILLED_SYRINGE | Freq: Once | INTRAMUSCULAR | Status: DC

## 2021-05-20 MED ORDER — PRENATAL MULTIVITAMIN CH
1.0000 | ORAL_TABLET | Freq: Every day | ORAL | Status: DC
  Administered 2021-05-20: 1 via ORAL
  Filled 2021-05-20: qty 1

## 2021-05-20 MED ORDER — FENTANYL CITRATE (PF) 100 MCG/2ML IJ SOLN
100.0000 ug | INTRAMUSCULAR | Status: DC | PRN
  Administered 2021-05-20: 100 ug via INTRAVENOUS

## 2021-05-20 MED ORDER — LIDOCAINE HCL (PF) 1 % IJ SOLN: 30.0000 mL | INTRAMUSCULAR | Status: DC | PRN

## 2021-05-20 MED ORDER — DIPHENHYDRAMINE HCL 25 MG PO CAPS: 25.0000 mg | ORAL_CAPSULE | Freq: Four times a day (QID) | ORAL | Status: DC | PRN

## 2021-05-20 MED ORDER — IBUPROFEN 600 MG PO TABS
600.0000 mg | ORAL_TABLET | Freq: Four times a day (QID) | ORAL | Status: DC
  Administered 2021-05-20 – 2021-05-21 (×4): 600 mg via ORAL
  Filled 2021-05-20 (×4): qty 1

## 2021-05-20 MED ORDER — LACTATED RINGERS IV SOLN: INTRAVENOUS | Status: DC

## 2021-05-20 MED ORDER — SOD CITRATE-CITRIC ACID 500-334 MG/5ML PO SOLN: 30.0000 mL | ORAL | Status: DC | PRN

## 2021-05-20 MED ORDER — OXYCODONE-ACETAMINOPHEN 5-325 MG PO TABS: 2.0000 | ORAL_TABLET | ORAL | Status: DC | PRN

## 2021-05-20 MED ORDER — ONDANSETRON HCL 4 MG PO TABS: 4.0000 mg | ORAL_TABLET | ORAL | Status: DC | PRN

## 2021-05-20 MED ORDER — OXYCODONE-ACETAMINOPHEN 5-325 MG PO TABS
1.0000 | ORAL_TABLET | ORAL | Status: DC | PRN
  Administered 2021-05-20: 1 via ORAL
  Filled 2021-05-20: qty 1

## 2021-05-20 MED ORDER — FENTANYL-BUPIVACAINE-NACL 0.5-0.125-0.9 MG/250ML-% EP SOLN
12.0000 mL/h | EPIDURAL | Status: DC | PRN
Start: 2021-05-20 — End: 2021-05-20

## 2021-05-20 MED ORDER — ACETAMINOPHEN 325 MG PO TABS: 650.0000 mg | ORAL_TABLET | ORAL | Status: DC | PRN

## 2021-05-20 MED ORDER — OXYTOCIN-SODIUM CHLORIDE 30-0.9 UT/500ML-% IV SOLN
2.5000 [IU]/h | INTRAVENOUS | Status: DC
  Filled 2021-05-20: qty 500

## 2021-05-20 MED ORDER — FENTANYL CITRATE (PF) 100 MCG/2ML IJ SOLN
INTRAMUSCULAR | Status: AC
  Filled 2021-05-20: qty 2

## 2021-05-20 MED ORDER — SIMETHICONE 80 MG PO CHEW: 80.0000 mg | CHEWABLE_TABLET | ORAL | Status: DC | PRN

## 2021-05-20 MED ORDER — WITCH HAZEL-GLYCERIN EX PADS
1.0000 "application " | MEDICATED_PAD | CUTANEOUS | Status: DC | PRN
  Administered 2021-05-20: 1 via TOPICAL

## 2021-05-20 MED ORDER — ACETAMINOPHEN 325 MG PO TABS
650.0000 mg | ORAL_TABLET | ORAL | Status: DC | PRN
  Administered 2021-05-20: 650 mg via ORAL
  Filled 2021-05-20: qty 2

## 2021-05-20 MED ORDER — LACTATED RINGERS IV SOLN: 500.0000 mL | Freq: Once | INTRAVENOUS | Status: DC

## 2021-05-20 MED ORDER — PHENYLEPHRINE 40 MCG/ML (10ML) SYRINGE FOR IV PUSH (FOR BLOOD PRESSURE SUPPORT): 80.0000 ug | PREFILLED_SYRINGE | INTRAVENOUS | Status: DC | PRN

## 2021-05-20 MED ORDER — BENZOCAINE-MENTHOL 20-0.5 % EX AERO
1.0000 "application " | INHALATION_SPRAY | CUTANEOUS | Status: DC | PRN
  Administered 2021-05-20: 1 via TOPICAL
  Filled 2021-05-20: qty 56

## 2021-05-20 MED ORDER — DIPHENHYDRAMINE HCL 50 MG/ML IJ SOLN: 12.5000 mg | INTRAMUSCULAR | Status: DC | PRN

## 2021-05-20 MED ORDER — ONDANSETRON HCL 4 MG/2ML IJ SOLN: 4.0000 mg | Freq: Four times a day (QID) | INTRAMUSCULAR | Status: DC | PRN

## 2021-05-20 MED ORDER — OXYTOCIN BOLUS FROM INFUSION
333.0000 mL | Freq: Once | INTRAVENOUS | Status: AC
  Administered 2021-05-20: 333 mL via INTRAVENOUS

## 2021-05-20 MED ORDER — OXYCODONE HCL 5 MG PO TABS: 10.0000 mg | ORAL_TABLET | ORAL | Status: DC | PRN

## 2021-05-20 MED ORDER — COCONUT OIL OIL: 1.0000 "application " | TOPICAL_OIL | Status: DC | PRN

## 2021-05-20 MED ORDER — LACTATED RINGERS IV SOLN: 500.0000 mL | INTRAVENOUS | Status: DC | PRN

## 2021-05-20 NOTE — H&P (Addendum)
OBSTETRIC ADMISSION HISTORY AND PHYSICAL  Sarah Miles is a 30 y.o. female G2P1001 with IUP at 58w2dby L/13 presenting for SOL (active labor) from [facility]. Reports she started having stronger contractions last night. She reports +FMs. Reported some LOF, unsure if her water had broken. Reports small amount of bloody discharge yesterday.  She plans on breast and bottle feeding. She request NFP/condoms for birth control. She [facility], had a positive Covid test yesterday.  Dating: By LMP --->  Estimated Date of Delivery: 06/01/21  Sono:    _0 , CWD, normal anatomy, cephalic presentation, anterior fundal lie, 2174g, 52% EFW   Prenatal History/Complications:  Suspected renal pyelectasis, resolved on 33w [place]19 infection, mild  Past Medical History: Past Medical History:  Diagnosis Date  . Medical history non-contributory     Past Surgical History: Past Surgical History:  Procedure Laterality Date  . APPENDECTOMY      Obstetrical History: OB History    Gravida  2   Para  1   Term  1   Preterm      AB      Living  1     SAB      IAB      Ectopic      Multiple  0   Live Births  1           Social History Social History   Socioeconomic History  . Marital status: Married    Spouse name: Not on file  . Number of children: Not on file  . Years of education: Not on file  . Highest education level: Not on file  Occupational History  . Not on file  Tobacco Use  . Smoking status: Never Smoker  . Smokeless tobacco: Never Used  Vaping Use  . Vaping Use: Never used  Substance and Sexual Activity  . Alcohol use: No  . Drug use: No  . Sexual activity: Yes  Other Topics Concern  . Not on file  Social History Narrative  . Not on file   Social Determinants of Health   Financial Resource Strain: Not on file  Food Insecurity: Not on file  Transportation Needs: Not on file   Physical Activity: Not on file  Stress: Not on file  Social Connections: Not on file    Family History: Family History  Problem Relation Age of Onset  . Diabetes Maternal Aunt   . Diabetes Paternal Grandfather     Allergies: No Known Allergies  Medications Prior to Admission  Medication Sig Dispense Refill Last Dose  . ID NOW COVID-19 KIT TEST AS DIRECTED TODAY     . Prenatal Vit-Fe Fumarate-FA (PRENATAL MULTIVITAMIN) TABS tablet Take 1 tablet by mouth daily at 12 noon.        Review of Systems   All systems reviewed and negative except as stated in HPI  Last menstrual period 08/25/2020, unknown if currently breastfeeding. General appearance: alert, cooperative and appears stated age, appears uncomfortable Lungs: nonlabored respirations Heart: regular rate Abdomen: gravid Psych: affect appropriate Presentation: cephalic Fetal monitoringBaseline: 125 bpm, Variability: Good {> 6 bpm), Accelerations: Reactive and Decelerations: Absent Uterine activityFrequency: Every 2-3 minutes Dilation: 8 Effacement (%): 90 Station: 0 Exam by:: MHansel FeinsteinCNM   Prenatal labs: ABO, Rh:  O+ Antibody:  neg Rubella:  Imm RPR:   NR HBsAg:   NR HIV:   NR GBS:   negative 1 hr Glucola wnl Genetic screening  AFP wnl Anatomy US wnl (suspected pyelectasis, not visualized on MFM [place])  Prenatal Transfer Tool  Maternal Diabetes: No Genetic Screening: Normal Maternal Ultrasounds/Referrals: Fetal renal pyelectasis, not visualized on follow-up MFM US Fetal Ultrasounds or other Referrals:  Referred to Materal Fetal Medicine  Maternal Substance Abuse:  No Significant Maternal Medications:  None Significant Maternal Lab Results: Group B Strep negative  Results for orders placed or performed during the hospital encounter of 05/20/21 (from the past 24 hour(s))  CBC   Collection Time: 05/20/21  5:25 AM  Result Value Ref Range   WBC 8.7 4.0 - 10.5 K/uL   RBC 4.46 3.87 - 5.11 MIL/uL    Hemoglobin 13.5 12.0 - 15.0 g/dL   HCT 39.7 36.0 - 46.0 %   MCV 89.0 80.0 - 100.0 fL   MCH 30.3 26.0 - 34.0 pg   MCHC 34.0 30.0 - 36.0 g/dL   RDW 12.9 11.5 - 15.5 %   Platelets 261 150 - 400 K/uL   nRBC 0.0 0.0 - 0.2 %    Patient Active Problem List   Diagnosis Date Noted  . Uterine contractions during pregnancy 05/20/2021    Assessment/Plan:  Sarah Miles is a 30 y.o. G2P1001 at 37w2dhere for SOL in active labor.  #Labor: active stage, expectant management. Expect NSVD. #Pain: IV fentanyl prn, no epidural #FWB: Cat I #ID:  GBS negative #MOF: breast/bottle #MOC:NFP/condoms #Circ:  N/A #COVID+: mild, airborne/contact precautions  RZola Button MD  05/20/2021, 5:47 AM    Attestation:  I confirm that I have verified the information documented in the resident's note and that I have also personally reperformed the physical exam and all medical decision making activities.   The patient was seen and examined by me also Agree with note NST reactive and reassuring UCs as listed Cervical exams as listed in note  WSeabron Spates CNM

## 2021-05-20 NOTE — MAU Note (Signed)
Pt in lobby  On knees. Assisted to room . ( Spanish Speaking). Stated she felt like the baby is coming out. Placed  On monitor. SVE 7/90/0 per L.leftwich-Kirby,CNM.   Orders for admit and  Report given to charge nurse and pt sent up to L&D

## 2021-05-20 NOTE — Lactation Note (Addendum)
This note was copied from a baby's chart. Lactation Consultation Note  Patient Name: Sarah Miles [id number] Date: 05/20/2021 Reason for consult: Initial assessment;Mother's request;Term Age: 31 hrs   Mom agreed that her husband could serve as Nurse, learning disability for this visit.  Mom states infant had one feeding at 6 am. LC reviewed with Mom feeding cues and importance to not let infant go more than 3-4 hrs without a feeding.   Plan 1. To feed based on cues 8-12x in24 hr period no more than 4 hrs without an attempt.         2. Mom to offer both breasts and ensure lips ar flanged out with a chin tug for lower lip. Mom to offer breast compression and stimulation to keep infant active during feeding looking for swallows. Mom aware to offer EBM via spoon/finger feeding if infant not able to latch first.          3. Mom provided with manual pump by RN, with instructions to pre pump 5-10 minutes before latching.          4. I and O sheet reviewed.          5. LC brochure of inpatient and outpatient services reviewed.   RN to go over pump parts with Mom. LC reviewed milk storage and Mom pre pumping to help elongate her nipples prior to latching.   Maternal Data Has patient been taught Hand Expression?: Yes Does the patient have breastfeeding experience prior to this delivery?: Yes How long did the patient breastfeed?: 1 year  Feeding Mother's Current Feeding Choice: Breast Milk and Formula  LATCH Score Latch: Repeated attempts needed to sustain latch, nipple held in mouth throughout feeding, stimulation needed to elicit sucking reflex.  Audible Swallowing: A few with stimulation  Type of Nipple: Everted at rest and after stimulation (but nipples are small and short shafted)  Comfort (Breast/Nipple): Filling, red/small blisters or bruises, mild/mod discomfort  Hold (Positioning): No assistance needed to correctly position infant at breast.  LATCH Score: 7   Lactation Tools  Discussed/Used Tools: Pump;Flanges Flange Size: 24 Breast pump type: Manual Pump Education: Setup, frequency, and cleaning;Milk Storage Reason for Pumping: increase stimulation Pumping frequency: Mom to pre pump 5-10 minutes before latching  Interventions Interventions: Breast feeding basics reviewed;Adjust position;Assisted with latch;Support pillows;Skin to skin;Position options;Education;Breast massage;Expressed milk;Hand express;Pre-pump if needed;Breast compression;Hand pump  Discharge Pump: Manual WIC Program: Yes (Mom in communication with WIC and will call when office opens)  Consult Status Consult Status: Follow-up Date: 05/21/21 Follow-up type: In-patient    Mayce Noyes  Nicholson-Springer 05/20/2021, 11:58 AM

## 2021-05-20 NOTE — Lactation Note (Signed)
Lactation Consultation Note  Patient Name: Sarah Miles [id number] Date: 05/20/2021 Reason for consult: L&D Initial assessment Age:30 y.o.   LC Initial Consult:  Spoke with RN to obtain mother's desire for lactation assistance in labor and delivery.  Mother had baby latched and will have follow up visits on the M/B unit.   Maternal Data    Feeding    LATCH Score                    Lactation Tools Discussed/Used    Interventions    Discharge    Consult Status Consult Status: Follow-up Date: 05/20/21 Follow-up type: In-patient    Dora Sims 05/20/2021, 6:44 AM

## 2021-05-20 NOTE — Discharge Summary (Signed)
Postpartum Discharge Summary    Patient Name: Sarah Miles DOB: 11-Aug-1991 MRN: [id number]  Date of admission: 05/20/2021 Delivery date:05/20/2021  Delivering provider: Seabron Spates  Date of discharge: 05/21/2021  Admitting diagnosis: Uterine contractions during pregnancy [O47.9] Intrauterine pregnancy: [redacted]w[redacted]d    Secondary diagnosis:  Active Problems:   Uterine contractions during pregnancy   COVID-19 affecting childbirth  Additional problems: Covid    Discharge diagnosis: Term Pregnancy Delivered and COVID                                              Post partum procedures:None Augmentation: N/A Complications: None  Hospital course: Onset of Labor With Vaginal Delivery      30y.o. yo G2P1001 at 357w2das admitted in Active Labor on 05/20/2021. Patient had an uncomplicated labor course as follows:  Arrived in [facility]U with advanced dilation and proceeded to deliver quickly thereafter Membrane Rupture Time/Date: 5:59 AM ,05/20/2021   Delivery Method:Vaginal, Spontaneous  Episiotomy: None  Lacerations:  1st degree   Has been doing well with covid.  Afebrile, oxygen sats 100%, no dyspnea Patient had an uncomplicated postpartum course.  She is ambulating, tolerating a regular diet, passing flatus, and urinating well. Patient is discharged home in stable condition on 05/21/21.  Newborn Data: Birth date:05/20/2021  Birth time:6:05 AM  Gender:Female  Living status:Living  Apgars:9 ,9  Weight:3331 g   Magnesium Sulfate received: No BMZ received: No Rhophylac:N/A MMR:No T-DaP:unsure Flu: Yes Transfusion:No  Physical exam  Vitals:   05/20/21 1347 05/20/21 1750 05/20/21 2100 05/21/21 0635  BP: 100/69 104/68 104/65 103/64  Pulse: 74 81 69 66  Resp: _0 Temp: 98.2 F (36.8 C) 97.7 F (36.5 C) 97.8 F (36.6 C) 97.9 F (36.6 C)  TempSrc: Oral Oral Oral Oral  SpO2: 100% 99% 100% 100%   General: alert, cooperative and no distress Lochia:  appropriate Uterine Fundus: firm Incision: N/A DVT Evaluation: No evidence of DVT seen on physical exam. Labs: Lab Results  Component Value Date   WBC 9.2 05/21/2021   HGB 12.0 05/21/2021   HCT 35.7 (L) 05/21/2021   MCV 89.3 05/21/2021   PLT 279 05/21/2021   CMP Latest Ref Rng & Units 11/20/2020  Glucose 70 - 99 mg/dL 99  BUN 6 - 20 mg/dL 7  Creatinine 0.44 - 1.00 mg/dL 0.60  Sodium 135 - 145 mmol/L 135  Potassium 3.5 - 5.1 mmol/L 3.4(L)  Chloride 98 - 111 mmol/L 106  CO2 22 - 32 mmol/L 21(L)  Calcium 8.9 - 10.3 mg/dL 8.7(L)   EdFlavia Shippercore: Edinburgh Postnatal Depression Scale Screening Tool 04/06/2018  I have been able to laugh and see the funny side of things. 0  I have looked forward with enjoyment to things. 0  I have blamed myself unnecessarily when things went wrong. 0  I have been anxious or worried for no good reason. 0  I have felt scared or panicky for no good reason. 0  Things have been getting on top of me. 0  I have been so unhappy that I have had difficulty sleeping. 0  I have felt sad or miserable. 0  I have been so unhappy that I have been crying. 0  The thought of harming myself has occurred to me. 0  Edinburgh Postnatal Depression Scale Total 0  After visit meds:  Allergies as of 05/21/2021   No Known Allergies     Medication List    TAKE these medications   guaiFENesin 600 MG 12 hr tablet Commonly known as: Mucinex Take 1 tablet (600 mg total) by mouth 2 (two) times daily.   ibuprofen 600 MG tablet Commonly known as: ADVIL Take 1 tablet (600 mg total) by mouth every 6 (six) hours.   ID Now COVID-19 Kit Generic drug: COVID-19 Test TEST AS DIRECTED TODAY   prenatal multivitamin Tabs tablet Take 1 tablet by mouth daily at 12 noon.        Discharge home in stable condition Infant Feeding: Breast Infant Disposition:home with mother Discharge instruction: per After Visit Summary and Postpartum booklet. Activity: Advance as  tolerated. Pelvic rest for 6 weeks.  Diet: routine diet Anticipated Birth Control: Condoms and NFP Postpartum Appointment:4 weeks Additional Postpartum F/U: none Future Appointments:No future appointments. Follow up Visit:  Follow-up Information    Department, [facility]. Schedule an appointment as soon as possible for a visit in 4 day(s).   Contact information: [place] [address] [phone]                   05/21/2021 Hansel Feinstein, CNM

## 2021-05-21 ENCOUNTER — Ambulatory Visit: Payer: Self-pay

## 2021-05-21 DIAGNOSIS — O9852 Other viral diseases complicating childbirth: Secondary | ICD-10-CM

## 2021-05-21 DIAGNOSIS — U071 COVID-19: Secondary | ICD-10-CM | POA: Diagnosis not present

## 2021-05-21 LAB — CBC
HCT: 35.7 % — ABNORMAL LOW (ref 36.0–46.0)
Hemoglobin: 12 g/dL (ref 12.0–15.0)
MCH: 30 pg (ref 26.0–34.0)
MCHC: 33.6 g/dL (ref 30.0–36.0)
MCV: 89.3 fL (ref 80.0–100.0)
Platelets: 279 10*3/uL (ref 150–400)
RBC: 4 MIL/uL (ref 3.87–5.11)
RDW: 12.9 % (ref 11.5–15.5)
WBC: 9.2 10*3/uL (ref 4.0–10.5)
nRBC: 0 % (ref 0.0–0.2)

## 2021-05-21 MED ORDER — IBUPROFEN 600 MG PO TABS: 600.0000 mg | ORAL_TABLET | Freq: Four times a day (QID) | ORAL | 0 refills | Status: DC

## 2021-05-21 MED ORDER — GUAIFENESIN ER 600 MG PO TB12: 600.0000 mg | ORAL_TABLET | Freq: Two times a day (BID) | ORAL | 11 refills | Status: AC

## 2021-05-21 NOTE — Discharge Instructions (Signed)
COVID-19: Qu hacer si est enfermo COVID-19: What to Do if You Are Sick Si tiene [place], tos u otros sntomas, podra tener COVID-19. La [employer] de las personas tiene una enfermedad leve y puede Fish farm manager. Si est enfermo:  Lleve un registro de los sntomas.  Si tiene una seal de advertencia de emergencia (que incluye dificultad para respirar), llame al 911. Pasos para ayudar a Manufacturing engineer del COVID-19 si est enfermo Si tiene COVID-19 o piensa que podra [employer] Technologies, siga los pasos a continuacin para cuidarse y Air traffic controller a Museum/gallery curator a Producer, television/film/video de su hogar y su comunidad. Qudese en su casa, excepto para obtener atencin mdica  [employer]. [employer] de las personas con COVID-19 tiene una enfermedad leve y puede recuperarse en el hogar sin atencin mdica. No salga de su casa, excepto para recibir atencin mdica. No visite lugares pblicos.  Cudese. Descanse y mantngase hidratado. Tome medicamentos de venta libre, como paracetamol, para sentirse mejor.  Mantngase en contacto con su mdico. Llame antes de recibir atencin mdica. Asegrese de recibir atencin si tiene dificultad para Ambulance person, o si tiene algn otro signo de advertencia de emergencia, o si cree que se trata de Engineer, maintenance (IT).  Evite el transporte pblico, compartir vehculos o tomar taxis. Seprese de [employer] Pacific En la mayor medida posible, permanezca en una habitacin especfica y alejado de otras personas y Herbalist. Si es posible, use un bao aparte. Si necesita estar cerca de otras personas o animales dentro o fuera de la casa, use [place]. Informe a sus Solicitor de que pueden haber estado expuestos al COVID-19. Ardelia Mems persona infectada puede propagar el COVID-19 a Proofreader de 48horas (o 2 das) antes de que la persona presente cualquier sntoma o tenga un resultado positivo en la prueba de deteccin. Al informar a sus contactos cercanos  acerca de que pueden haber estado expuestos al COVID-19, est ayudando a proteger a todos.  Se dispone de orientacin adicional para aquellos que viven en barrios cercanos y viviendas compartidas.  Consulte COVID-19 y los animales si tienes preguntas [employer].  Si se le diagnostica COVID-19, alguien del departamento de Chartered certified accountant. Responda la llamada para Printmaker. Controle sus sntomas  Los sntomas de COVID-19 incluyen [place], tos u otros sntomas.  Siga las instrucciones del mdico y del departamento de salud local. Las autoridades de salud locales podrn darle instrucciones para Chief Technology Officer sus sntomas y Doctor, hospital informacin. Cundo solicitar atencin mdica de emergencia Est atento a los signos de advertencia de emergencia* para el COVID-19. Si alguien [place] alguno de estos signos, solicite atencin mdica de Freight forwarder de inmediato:  Psychologist, clinical u opresin persistentes en el pecho  Confusin nueva  Incapacidad de despertarse o permanecer despierto  Piel, labios o lechos de las uas plidos o de color grisceo o [place], segn el tono de la piel *En esta lista no estn todos los sntomas posibles. Llame al mdico si tiene otros sntomas que sean graves o le preocupen. Llame al 911 o llame con anticipacin al centro de emergencias de su localidad: Informe al operador que est buscando atencin para alguien que tiene o puede tener COVID-19. Llame con anticipacin antes de visitar al mdico  Llame con anticipacin. Muchas visitas mdicas para la atencin de rutina se estn posponiendo o se realizan por telfono o telemedicina.  Si tiene una cita mdica que no se puede posponer, llame al [employer] del mdico  e informe que tiene o puede tener COVID-19. Esto ayudar a que quienes trabajan en el consultorio puedan protegerse y Museum/gallery curator a Information systems manager. Somtase  a pruebas de Programme researcher, broadcasting/film/video  Si tiene sntomas de COVID-19,  somtase a pruebas de deteccin. Mientras espera los resultados de las pruebas, [place] de los dems, incluso mantngase apartado de las personas que viven en su hogar.  Puede visitar el sitio web de su departamento de saludestatal, tribal, local y territorial para Animator informacin local ms reciente sobre las pruebas de Programme researcher, broadcasting/film/video. Si est enfermo, use una Le[employer] Brothers nariz y la boca.  Debe usar [employer] Corporation la Lawyer y la boca si debe estar cerca de otras personas o [place], incluidas las mascotas (incluso en su casa).  No es necesario que use la mascarilla si est solo. Si no puede ponerse Geographical information systems officer (debido a que tiene dificultad para Ambulance person, por ejemplo), cbrase de algn otro modo cuando tosa o estornude. Trate de mantenerse al menos a una distancia de 6 pies (1,47m) de las Comptroller. Esto ayudar a proteger a las personas que lo rodean.  Los nios menores de 2 aos de edad, las personas con problemas para Ambulance person o las personas que no pueden quitarse las mascarillas sin ayuda no deben Cytogeneticist. Nota: Durante la pandemia de COVID-19, los barbijos de grado mdico estn reservados para los trabajadores de la salud y para Clinical research associate personas que brindan asistencia en casos de Multimedia programmer. Cbrase al toser y estornudar  Cbrase la boca y la nariz con un pauelo descartable cuando tosa o estornude.  Arroje los pauelos descartables usados en un cesto de basura que tenga [place].  Inmediatamente, lvese las manos con agua y [place] durante al menos 20segundos. Si no dispone de agua y [place], Google las manos con un desinfectante de manos a base de alcohol que contenga al menos un 60% de alcohol. Lmpiese las manos con frecuencia  Lvese las manos frecuentemente con agua y jabn durante al menos 20segundos. Esto es especialmente importante despus de sonarse la nariz, toser o estornudar, ir al bao y antes de comer o preparar alimentos.  Utilice un  desinfectante para manos si no dispone de [place] y [place]. Use un desinfectante para manos a base de alcohol con al menos un 60% de alcohol, cubrindose todas las superficies de las manos y frotndoselas hasta que se sientan secas.  Usar agua y jabn es la mejor opcin, especialmente si las manos estn sucias.  No se toque los ojos, la nariz ni la boca sin antes lavarse las manos.  Consejos para el lavado de manos Evite compartir artculos domsticos personales  No comparta platos, vasos, tazas, utensilios para comer, toallas ni ropa de cama con otras personas en su casa.  Despus de [employer], lvelos bien con agua y jabn o pngalos en el lavavajillas. Limpie todos los das todas las superficies que se toquen con frecuencia.  Limpie y desinfecte las superficies que se toquen con frecuencia en su "habitacin de enfermo" y el bao; use guantes descartables. Deje que otra persona limpie y desinfecte las superficies de las zonas comunes, pero usted debe limpiar su habitacin y su bao, si es posible.  Si un cuidador u Hotel manager y Barrister's clerk habitacin o el bao de una persona enferma, debe hacerlo segn sea necesario. El cuidador/otra persona debe usar una mascarilla y guantes desechables antes de la limpieza. Deben esperar el mayor tiempo posible despus de que la persona enferma  haya usado el bao antes de ir a limpiar y Biomedical engineer. ? Las superficies que se tocan con frecuencia incluyen telfonos, controles remotos, encimeras, mesadas, picaportes, artefactos del bao, inodoros, teclados, tabletas y [address] de [place].  Limpie y desinfecte las zonas que puedan tener [place], heces o lquidos corporales en su superficie.  Use limpiadores y desinfectantes domsticos. Limpie la zona o el elemento con agua y [place] o con otro detergente si est sucio. Luego, use un desinfectante de uso domstico. ? No olvide seguir las instrucciones de la etiqueta para asegurarse de usar el  producto de Wellsite geologist segura y Engineer, manufacturing. En el caso de muchos productos, se recomienda mantener la superficie hmeda durante algunos minutos para asegurarse de que se destruyan los grmenes. En muchos otros casos, tambin se recomiendan precauciones, como el uso de guantes y asegurarse de Warehouse manager buena ventilacin durante el uso del producto. ? Use un producto de la lista N de Financial controller (EPA) (Agencia de Proteccin Ambiental): Desinfectantes para coronavirus (COVID-19). ? Orientacin para la desinfeccin completa Cundo puede estar cerca de otras personas despus de tener COVID-19 Decidir cundo puede rodearse de otras personas es diferente para distintas situaciones. Averige cundo puede finalizar el aislamiento en casa de forma segura. Para cualquier pregunta adicional sobre su atencin, pngase en contacto con su mdico o con el departamento de salud local o estatal. 02/25/2019 Lenell Antu del contenido: [employer] Incorporated for Immunization and Respiratory Diseases (NCIRD), Division of Viral Diseases (Divisin de Enfermedades Virales del [address] de [place] y [place]) Esta informacin no tiene Theme park manager el consejo del mdico. Asegrese de hacerle al mdico cualquier pregunta que tenga. Document Revised: 10/23/2020 Document Reviewed: 10/23/2020 Elsevier Patient Education  2021 Elsevier Inc. Parto vaginal, cuidados de puerperio Postpartum Care After Vaginal Delivery La siguiente informacin ofrece una gua sobre cmo cuidarse desde el momento en que nazca su beb y Sarah Miles 6 a 12 semanas despus del parto (perodo del posparto). Si tiene problemas o preguntas, pngase en contacto con su mdico para obtener instrucciones ms especficas. Siga estas instrucciones en su casa: Hemorragia vaginal  Es normal tener un poco de hemorragia vaginal (loquios) despus del parto. Use un apsito sanitario para el sangrado y secrecin. ? Durante la primera semana  despus del parto, la cantidad y el aspecto de los loquios a menudo es similar a los del perodo menstrual. ? Durante las siguientes semanas disminuir gradualmente hasta convertirse en una secrecin seca amarronada o [place]. ? En la Lennar Corporation, los loquios se detienen Guardian Life Insurance 4 a 6semanas despus del parto, pero esto puede variar.  Cambie los apsitos sanitarios con frecuencia. Observe si hay cambios en el flujo, como: ? Aumento repentino en el volumen. ? Cambio en el color. ? Cogulos de [employer]Inland.  Si expulsa un cogulo de sangre por la vagina, gurdelo y llame al mdico. No deseche los cogulos de sangre por el inodoro antes de hablar con su mdico.  No use tampones ni se haga duchas vaginales hasta que el mdico la autorice.  Si no est amamantando, volver a tener su perodo entre 6 y 8 semanas despus del parto. Si solamente alimenta al beb con [employer], podra no volver a tener su perodo hasta que deje de [place]. Cuidados perineales  Mantenga la zona entre la vagina y el ano (perineo) limpia y Magazine features editor. Utilice apsitos o [employer] Engineering y Control and instrumentation engineer, como se lo hayan indicado.  Si le hicieron un corte quirrgico en el perineo (  episiotoma) o tuvo un desgarro, controle la zona para detectar signos de infeccin hasta que sane. Est atenta a los siguientes signos: ? Aumento del enrojecimiento, la hinchazn o Chief Technology Officer. ? Presenta lquido o sangre que supura del corte o Insurance account manager. ? [place]. ? Pus o mal olor.  Es posible que le den una botella rociadora para que use en lugar de limpiarse el rea con papel higinico despus de usar el bao. Seque la zona dando golpecitos suaves.  Para aliviar el dolor causado por una episiotoma, un desgarro o venas hinchadas en el ano (hemorroides), tome un bao de asiento tibio 2 o 3 veces por da. En un bao de asiento, el agua tibia solamente debe llegar [employer] caderas y [employer] Company.   Cuidado de las  [address]  En los [address] despus del parto, las mamas pueden sentirse pesadas, llenas e incmodas (congestin [place]). Tambin puede escaparse leche de sus senos. Pdale al mdico que le sugiera formas de Acupuncturist.  Si est amamantando: ? Use un sostn que sujete las mamas y [place]. Utilice protectores mamarios para Insurance account manager Mirant se filtre. ? Mantenga los pezones secos y limpios. Aplique cremas y Energy Transfer Partners se lo hayan indicado. ? Puede tener contracciones uterinas cada vez que amamante durante varias semanas despus del [place]. Esto ayuda a que el tero vuelva a su tamao normal. ? Si tiene algn problema con la lactancia materna, infrmelo al mdico o a un Holiday representative.  Si no est amamantando: ? Evite tocarse las mamas. No extraiga (saque) [employer] Pal[employer]ive. Al hacerlo, podran producir ms leche. ? Use un sostn que le proporcione el ajuste correcto y compresas fras para reducir la hinchazn. Intimidad y sexualidad  Pregntele al mdico cundo puede retomar la actividad sexual. Esto puede depender de lo siguiente: ? Su riesgo de sufrir infecciones. ? La rapidez con la que est sanando. ? Su comodidad y deseo de retomar la actividad sexual.  Despus del parto, puede quedar embarazada incluso si no ha tenido todava su perodo. Hable con el mdico acerca de los mtodos de control de la natalidad (mtodos anticonceptivos) o planificacin familiar si desea Social research officer, government en el futuro. Medicamentos  Use los medicamentos de venta libre y los recetados solamente como se lo haya indicado el mdico.  Tome un laxante de venta libre para ayudar con las deposiciones como se lo haya indicado el mdico.  Si le recetaron un antibitico, tmelo como se lo haya indicado el mdico. No deje de tomar el antibitico aunque comience a sentirse mejor.  Revise todos los medicamentos con Engineer, civil (consulting) y actuales para comprobar la posible transferencia a la [place]. Actividad  Retome sus actividades normales de a poco segn lo indicado por el mdico.  Descanse todo lo que pueda. Tome siestas mientras el beb duerme. Comida y bebida  Beba suficiente lquido como para Pharmacologist la orina de color amarillo plido.  Para ayudar a prevenir o Educational psychologist, coma alimentos ricos en [employer].  Elija una alimentacin saludable para ayudar a la lactancia o los objetivos de prdida de [place].  Tome sus vitaminas prenatales hasta que su mdico le indique que deje de [place].   Recomendaciones/consejos generales  No consuma ningn producto que contenga nicotina o tabaco. Estos productos incluyen cigarrillos, tabaco para Theatre manager y aparatos de vapeo, como los Administrator, Civil Service. Si necesita ayuda para dejar de fumar, consulte al mdico.  No beba alcohol, especialmente si est amamantando.  No tome medicamentos o frmacos que no se le receten, especialmente si est en perodo de lactancia.  Visite al mdico para un control de posparto dentro de las primeras 3 a 6 semanas despus del [place].  Realice una visita posparto integral a ms tardar 12 semanas despus del parto.  Asista a todas las visitas de seguimiento para usted y su beb. Comunquese con un mdico si:  Siente tristeza o preocupacin de forma inusual.  Las mamas se ponen rojas, le duelen o se endurecen.  Tiene fiebre u otros signos de infeccin.  Tiene sangrado que est empapando una compresa por hora o tiene cogulos de [place].  Siente un dolor de cabeza intenso que no se [place] o tiene cambios en la visin.  Tiene nuseas y vmitos y no puede comer o beber nada durante 24horas. Solicite ayuda de inmediato si:  Tiene dolor en el pecho o dificultad para respirar.  Tiene un dolor repentino e intenso en la pierna.  Tiene una convulsin o se desmaya.  Tiene pensamientos acerca de lastimarse a usted misma o a su beb. Si alguna vez siente que puede  lastimarse o Physicist, medical a Economist, o tiene pensamientos de poner fin a su vida, busque ayuda de inmediato. Dirjase al servicio de urgencias ms cercano o:  Comunquese con el servicio de emergencias de su localidad (911 en los [place]).  National Suicide Prevention Lifeline (Lnea Telefnica Nacional para la Prevencin del Suicidio) al [phone]. Esta lnea de asistencia al suicida est abierta las 24 horas del da.  Enve un mensaje de texto a la lnea para casos de crisis al [phone] (en los EE.UU.). Resumen  El perodo de tiempo despus el parto y Sarah Miles 6 a 12 semanas despus del parto se denomina perodo posparto.  Asista a todas las visitas de seguimiento para usted y su beb.  Revise todos los medicamentos con Engineer, civil (consulting) y actuales para comprobar la posible transferencia a la [place].  Pngase en contacto con un mdico si se siente inusualmente triste o preocupada durante el perodo posparto. Esta informacin no tiene Theme park manager el consejo del mdico. Asegrese de hacerle al mdico cualquier pregunta que tenga. Document Revised: 10/12/2020 Document Reviewed: 09/19/2020 Elsevier Patient Education  2021 [employer].

## 2021-05-21 NOTE — Lactation Note (Signed)
This note was copied from a baby's chart. Lactation Consultation Note  Patient Name: Sarah Miles [id number] Date: 05/21/2021 Reason for consult: Follow-up assessment Age:31 hours   LC Follow Up Visit:  Per RN, mother declined lactation; family ready to leave the hospital.   Maternal Data    Feeding    LATCH Score                    Lactation Tools Discussed/Used    Interventions    Discharge    Consult Status Consult Status: Complete (mother declined follow up) Date: 05/21/21 Follow-up type: Call as needed    [name] R [name] 05/21/2021, 12:21 PM

## 2022-03-20 IMAGING — US US MFM OB DETAIL+14 WK
1 series · 13 of 28 positions shown · non-contrast
Comparison: none

[Series 1: us mfm ob detail+14 wk · 13 of 86 slices shown]
[im 4/86]
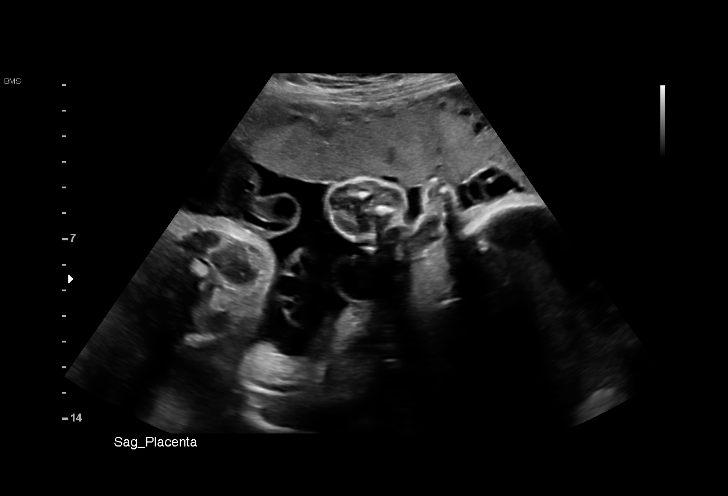
[im 10/86]
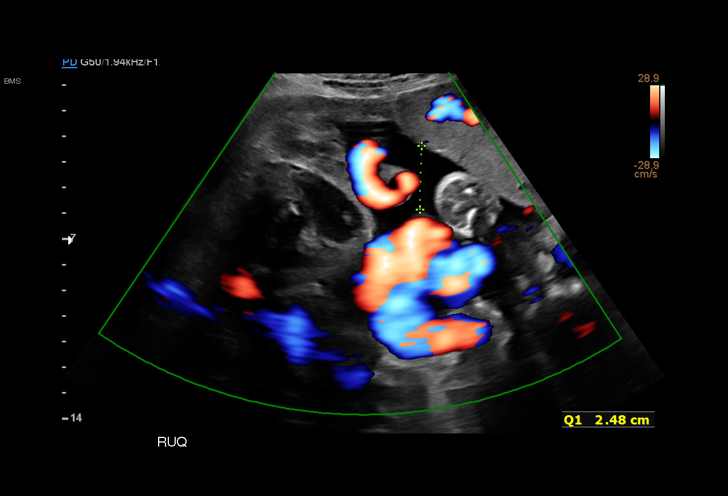
[im 16/86]
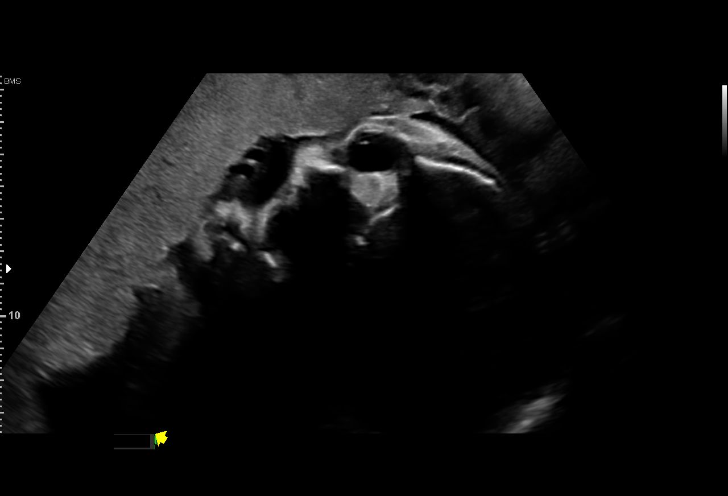
[im 23/86]
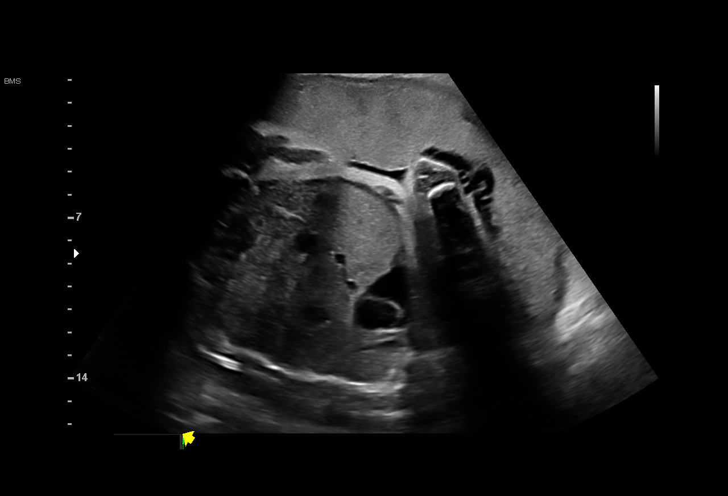
[im 29/86]
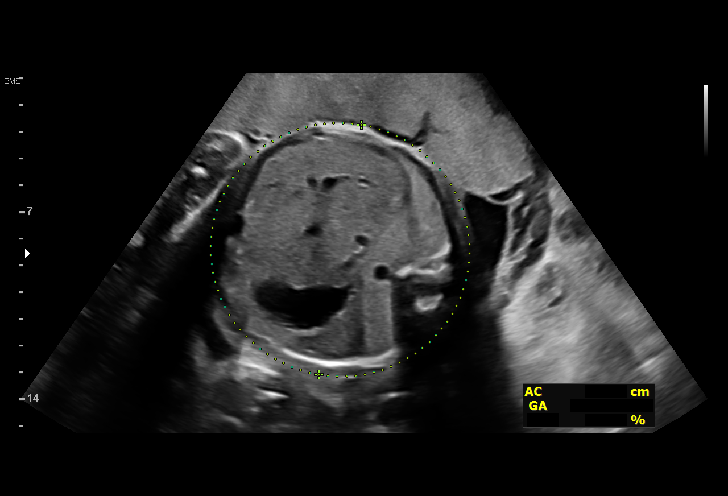
[im 35/86]
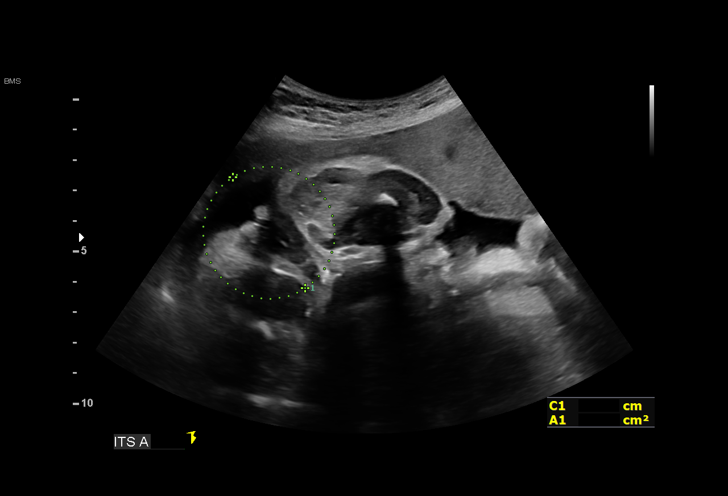
[im 45/86]
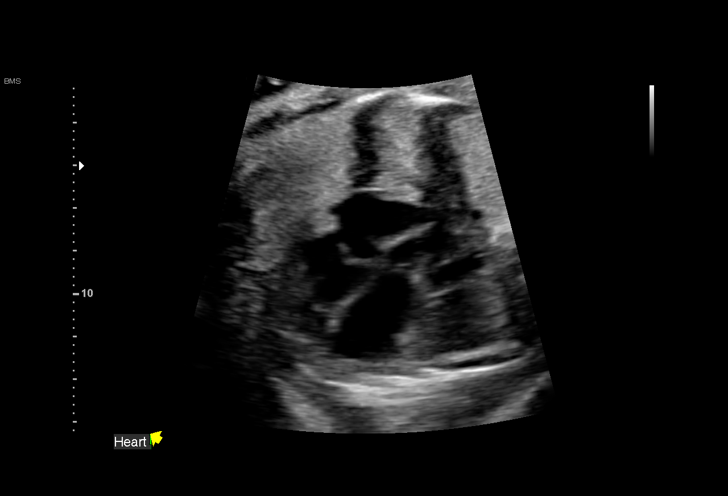
[im 51/86]
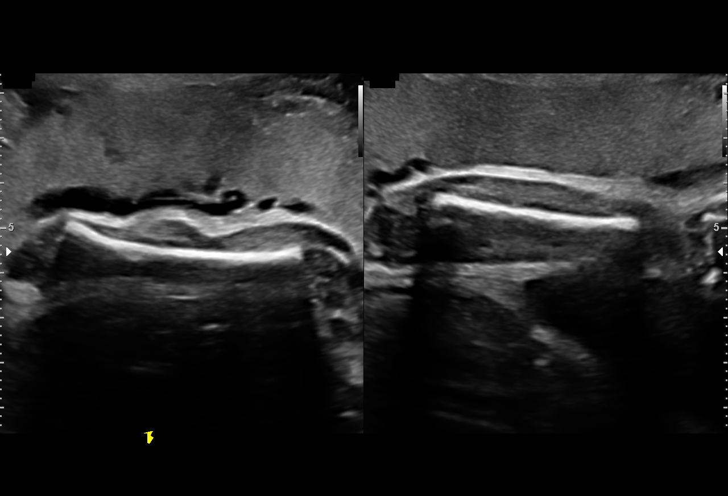
[im 57/86]
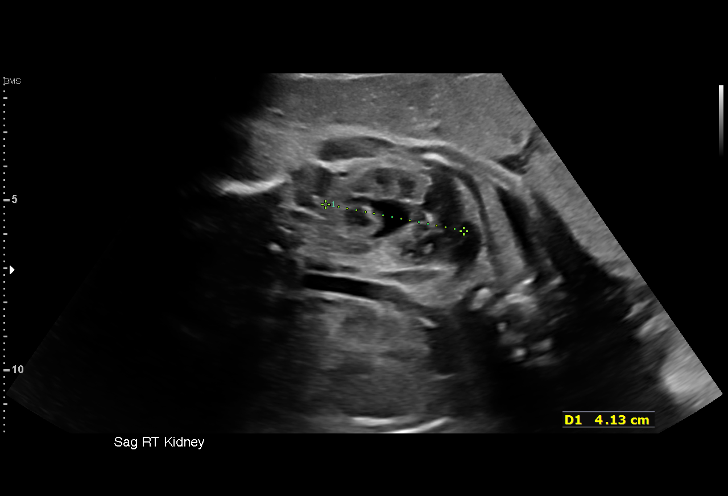
[im 63/86]
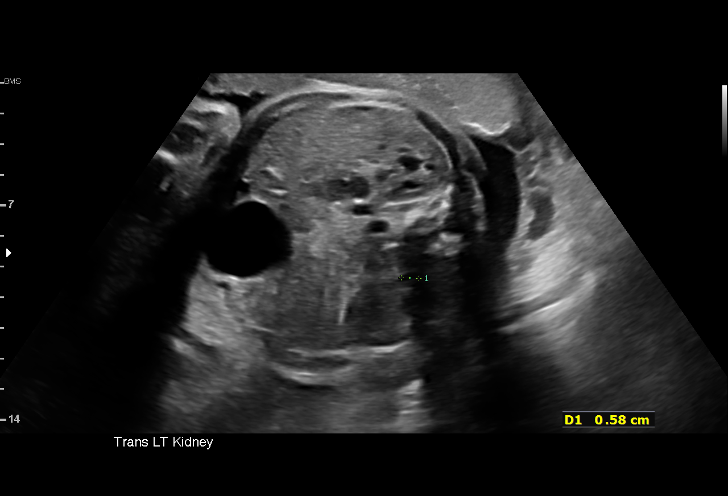
[im 70/86]
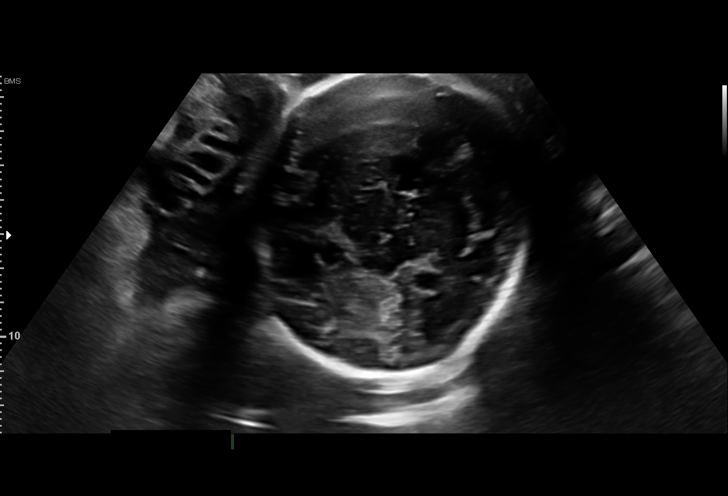
[im 76/86]
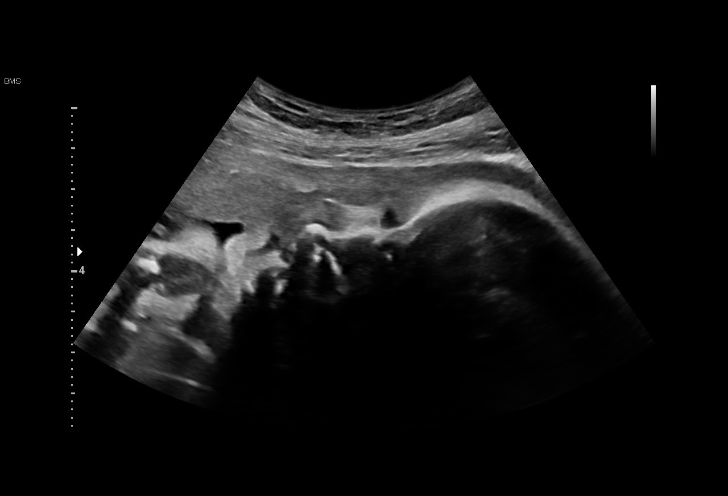
[im 82/86]
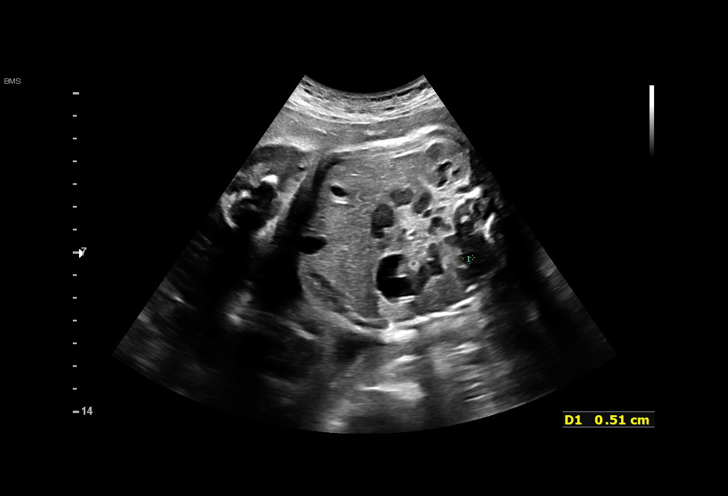

[13 of 28 positions shown; findings below may reference images not displayed]

Health Department
                                                            [address]
                                                            [HOSPITAL] [id number]
                   NP

Indications

 33 weeks gestation of pregnancy
 Encounter for antenatal screening for
 malformations
 Abnormal fetal ultrasound (Pyelectasis)
Fetal Evaluation

 Num Of Fetuses:         1
 Fetal Heart Rate(bpm):  157
 Cardiac Activity:       Observed
 Presentation:           Cephalic
 Placenta:               Anterior Fundal
 P. Cord Insertion:      Visualized, central

 Amniotic Fluid
 AFI FV:      Within normal limits

 AFI Sum(cm)     %Tile       Largest Pocket(cm)
 11.14           26

 RUQ(cm)       RLQ(cm)       LUQ(cm)        LLQ(cm)

Biometry

 BPD:      82.7  mm     G. Age:  33w 2d         52  %    CI:         76.3   %    70 - 86
                                                         FL/HC:      20.0   %    19.9 -
 HC:       300   mm     G. Age:  33w 2d         20  %    HC/AC:      0.98        0.96 -
 AC:      305.6  mm     G. Age:  34w 4d         88  %    FL/BPD:     72.4   %    71 - 87
 FL:       59.9  mm     G. Age:  31w 1d          5  %    FL/AC:      19.6   %    20 - 24
 HUM:      52.3  mm     G. Age:  30w 3d          8  %
 CER:      37.2  mm     G. Age:  30w 5d      < 2.3  %

 Est. FW:    0709  gm    4 lb 13 oz      52  %
OB History

 Gravidity:    2         Term:   1
 Living:       1
Gestational Age

 LMP:           33w 0d        Date:  08/25/20                 EDD:   06/01/21
 U/S Today:     33w 1d                                        EDD:   05/31/21
 Best:          33w 0d     Det. By:  LMP  (08/25/20)          EDD:   06/01/21
Anatomy

 Cranium:               Appears normal         Aortic Arch:            Not well visualized
 Cavum:                 Appears normal         Ductal Arch:            Not well visualized
 Ventricles:            Appears normal         Diaphragm:              Appears normal
 Choroid Plexus:        Appears normal         Stomach:                Appears normal, left
                                                                       sided
 Cerebellum:            Appears normal         Abdomen:                Appears normal
 Posterior Fossa:       Appears normal         Abdominal Wall:         Appears nml (cord
                                                                       insert, abd wall)
 Nuchal Fold:           Not applicable (>20    Cord Vessels:           Appears normal (3
                        wks GA)                                        vessel cord)
 Face:                  Orbits nl; profile not Kidneys:                The Rt was 3.7 and
                        well visualized                                Lt 5.4 mm
 Lips:                  Not well visualized    Bladder:                Appears normal
 Thoracic:              Appears normal         Spine:                  Limited views
                                                                       appear normal
 Heart:                 Appears normal         Upper Extremities:      RUE Vis, LUE nwv
                        (4CH, axis, and
                        situs)
 RVOT:                  Appears normal         Lower Extremities:      Appears normal
 LVOT:                  Appears normal

 Other:  Technically difficult due to advanced gestational age. Fetus appears
         to be female.
Cervix Uterus Adnexa

 Cervix
 Not visualized (advanced GA >97wks)

 Uterus
 No abnormality visualized.

 Right Ovary
 Not visualized.

 Left Ovary
 Not visualized.
 Cul De Sac
 No free fluid seen.

 Adnexa
 No adnexal mass visualized.
Impression

 Single intrauterine pregnancy here for a detailed anatomy
 due to suspected renal pyelecatsis of 9 mm on the left and 5
 mm on the right.
 Normal anatomy with measurements consistent with dates
 There is good fetal movement and amniotic fluid volume
 Suboptimal views of the fetal anatomy were obtained
 secondary to fetal position and advanced gestational age.

 I discussed with Ms. Shalley Jim discussed with normal
 bilateral renal pelvis measurements. Her prior exam for the
 right kidney was also normal given the that the AP diameter
 was <  7 mm.  I discussed the etiology of renal pyelectasis to
 include normal variant, ureteropelvic or vesicle junction
 obstruction and urethrovesicle reflux.  Prior to 28 weeks the
 threshold for abnormal is <4mm but after 28 weeks >7 mm.
 At times ultrasound meaurements can appear large as the
 kidney's fill. However, measurements should be taken at their
 lowest point in the transverse spine up position.

 I explained that not further evaluation is warranted at this
 time.
Recommendations

 Follow up growth as clinically indicated.

## 2022-06-23 ENCOUNTER — Encounter (HOSPITAL_COMMUNITY): Payer: Self-pay

## 2022-06-23 ENCOUNTER — Emergency Department (HOSPITAL_COMMUNITY)
Admission: EM | Admit: 2022-06-23 | Discharge: 2022-06-24 | Disposition: A | Discharge status: Home / Self Care | Payer: Self-pay | Attending: Emergency Medicine | Admitting: Emergency Medicine

## 2022-06-23 DIAGNOSIS — H7493 Unspecified disorder of middle ear and mastoid, bilateral: Secondary | ICD-10-CM | POA: Insufficient documentation

## 2022-06-23 NOTE — ED Triage Notes (Signed)
Pt reports that for the past month she has been having bilateral ear pain, throat pain and neck pain, went to see PCP 3 times and was given several different meds that have not worked, referred to ENT and unable to get in with them for another month

## 2022-06-24 ENCOUNTER — Emergency Department (HOSPITAL_COMMUNITY): Payer: Self-pay

## 2022-06-24 ENCOUNTER — Other Ambulatory Visit: Payer: Self-pay

## 2022-06-24 LAB — CBC WITH DIFFERENTIAL/PLATELET
Abs Immature Granulocytes: 0.02 10*3/uL (ref 0.00–0.07)
Basophils Absolute: 0 10*3/uL (ref 0.0–0.1)
Basophils Relative: 1 %
Eosinophils Absolute: 0.2 10*3/uL (ref 0.0–0.5)
Eosinophils Relative: 3 %
HCT: 42.9 % (ref 36.0–46.0)
Hemoglobin: 14.8 g/dL (ref 12.0–15.0)
Immature Granulocytes: 0 %
Lymphocytes Relative: 31 %
Lymphs Abs: 2.2 10*3/uL (ref 0.7–4.0)
MCH: 30 pg (ref 26.0–34.0)
MCHC: 34.5 g/dL (ref 30.0–36.0)
MCV: 87 fL (ref 80.0–100.0)
Monocytes Absolute: 0.5 10*3/uL (ref 0.1–1.0)
Monocytes Relative: 7 %
Neutro Abs: 4.2 10*3/uL (ref 1.7–7.7)
Neutrophils Relative %: 58 %
Platelets: 383 10*3/uL (ref 150–400)
RBC: 4.93 MIL/uL (ref 3.87–5.11)
RDW: 12.3 % (ref 11.5–15.5)
WBC: 7.2 10*3/uL (ref 4.0–10.5)
nRBC: 0 % (ref 0.0–0.2)

## 2022-06-24 LAB — I-STAT CHEM 8, ED
BUN: 12 mg/dL (ref 6–20)
Calcium, Ion: 1.16 mmol/L (ref 1.15–1.40)
Chloride: 105 mmol/L (ref 98–111)
Creatinine, Ser: 0.6 mg/dL (ref 0.44–1.00)
Glucose, Bld: 91 mg/dL (ref 70–99)
HCT: 43 % (ref 36.0–46.0)
Hemoglobin: 14.6 g/dL (ref 12.0–15.0)
Potassium: 4 mmol/L (ref 3.5–5.1)
Sodium: 139 mmol/L (ref 135–145)
TCO2: 23 mmol/L (ref 22–32)

## 2022-06-24 LAB — I-STAT BETA HCG BLOOD, ED (MC, WL, AP ONLY): I-stat hCG, quantitative: <5 m[IU]/mL (ref <5)

## 2022-06-24 MED ORDER — IOHEXOL 300 MG/ML  SOLN
75.0000 mL | Freq: Once | INTRAMUSCULAR | Status: AC | PRN
  Administered 2022-06-24: 75 mL via INTRAVENOUS

## 2022-06-24 MED ORDER — DEXAMETHASONE SODIUM PHOSPHATE 10 MG/ML IJ SOLN
10.0000 mg | Freq: Once | INTRAMUSCULAR | Status: AC
  Administered 2022-06-24: 10 mg via INTRAVENOUS
  Filled 2022-06-24: qty 1

## 2022-06-24 MED ORDER — FLUTICASONE PROPIONATE 50 MCG/ACT NA SUSP: 1.0000 | Freq: Every day | NASAL | 2 refills | Status: AC

## 2022-06-24 MED ORDER — OXYCODONE-ACETAMINOPHEN 5-325 MG PO TABS
1.0000 | ORAL_TABLET | Freq: Once | ORAL | Status: AC
  Administered 2022-06-24: 1 via ORAL
  Filled 2022-06-24: qty 1

## 2022-06-24 NOTE — ED Provider Notes (Signed)
[facility] EMERGENCY DEPARTMENT Provider Note   CSN: [id number] Arrival date & time: 06/23/22  2241     History  Chief Complaint  Patient presents with   Ear Fullness    Sarah Miles is a 31 y.o. female.  The history is provided by the patient and medical records.  Ear Fullness   31 y.o. F here with bilateral ear fullness, right worse than left, for approx 8 weeks.  States she has completed courses of amoxicillin and cefdinir without any change.  She has tried OTC decongestants, ear drops, etc but symptoms continue worsening.  She reports partial hearing loss in right ear as well.  Denies any bleeding or drainage from the ears.  No fevers.  States she was referred to ENT but cannot be seen for 2 months.  Home Medications Prior to Admission medications   Medication Sig Start Date End Date Taking? Authorizing Provider  ibuprofen (ADVIL) 600 MG tablet Take 1 tablet (600 mg total) by mouth every 6 (six) hours. 05/21/21   Seabron Spates, CNM  ID NOW COVID-19 KIT TEST AS DIRECTED TODAY 11/26/20   [provider]  Prenatal Vit-Fe Fumarate-FA (PRENATAL MULTIVITAMIN) TABS tablet Take 1 tablet by mouth daily at 12 noon.    [provider]      Allergies    Patient has no known allergies.    Review of Systems   Review of Systems  HENT:  Positive for ear pain and hearing loss.   All other systems reviewed and are negative.   Physical Exam Updated Vital Signs BP (!) 135/98   Pulse 75   Temp 98.8 F (37.1 C) (Oral)   Resp 18   SpO2 99%   Physical Exam Vitals and nursing note reviewed.  Constitutional:      Appearance: She is well-developed.  HENT:     Head: Normocephalic and atraumatic.     Ears:     Comments: Appears to have darkened blood pooled behind right TM but remains intact without findings of perforation, mildly tender along mastoid but no swelling or erythema present Left ear with effusion present, mastoid  non-tender Eyes:     Conjunctiva/sclera: Conjunctivae normal.     Pupils: Pupils are equal, round, and reactive to light.  Cardiovascular:     Rate and Rhythm: Normal rate and regular rhythm.     Heart sounds: Normal heart sounds.  Pulmonary:     Effort: Pulmonary effort is normal.     Breath sounds: Normal breath sounds.  Abdominal:     General: Bowel sounds are normal.     Palpations: Abdomen is soft.  Musculoskeletal:        General: Normal range of motion.     Cervical back: Normal range of motion.  Skin:    General: Skin is warm and dry.  Neurological:     Mental Status: She is alert and oriented to person, place, and time.     ED Results / Procedures / Treatments   Labs (all labs ordered are listed, but only abnormal results are displayed) Labs Reviewed  CBC WITH DIFFERENTIAL/PLATELET  I-STAT CHEM 8, ED  I-STAT BETA HCG BLOOD, ED ([facility], [facility], [facility] ONLY)    EKG None  Radiology CT Temporal Bones W Contrast  Result Date: 06/24/2022 CLINICAL DATA:  Initial evaluation for 1 month history of bilateral ear pain, throat pain, neck pain. Hearing loss. EXAM: CT TEMPORAL BONES WITH CONTRAST TECHNIQUE: Axial and coronal plane CT imaging of the  petrous temporal bones was performed with thin-collimation image reconstruction following intravenous contrast administration. Multiplanar CT image reconstructions were also generated. RADIATION DOSE REDUCTION: This exam was performed according to the departmental dose-optimization program which includes automated exposure control, adjustment of the mA and/or kV according to patient size and/or use of iterative reconstruction technique. CONTRAST:  80m OMNIPAQUE IOHEXOL 300 MG/ML  SOLN COMPARISON:  None Available. FINDINGS: RIGHT TEMPORAL BONE External auditory canal: External auditory canal is clear. Tympanic membrane mildly retracted and irregular but appears intact. Middle ear cavity: Scattered soft tissue opacity present within the middle ear  cavity, most pronounced within the hypo tympanum. Opacification of the facial recess. Small amount of soft tissue density within Prussak's space. Scutum grossly intact without erosion. Ossicular chain intact without erosion. Tegmen tympani intact without dehiscence. Inner ear structures: Vestibule, semi circular canals, and cochlea within normal limits. Vestibular aqueduct within normal limits. Internal auditory and facial nerve canals: No CP angle mass. Internal auditory canal within normal limits. Facial nerve canal intact and bony covered to the stylomastoid foramen. Mastoid air cells: Moderate opacification of the mastoid air cells. No erosive changes or coalescence. Sigmoid plate intact. LEFT TEMPORAL BONE External auditory canal: Left EAC is clear. Tympanic membrane thin and grossly intact. Middle ear cavity: Mild scattered soft tissue opacification of the left middle ear cavity, most pronounced at Prussak's space. Scutum intact without visible erosion to suggest cholesteatoma. Ossicular chain intact. Tegmen tympani intact without dehiscence. Inner ear structures: Vestibule, semi circular canals, and cochlea within normal limits. Vestibular aqueduct normal. Internal auditory and facial nerve canals: No CP angle mass. Internal auditory canal within normal limits. Facial nerve canal intact and bony covered to the stylomastoid foramen. Mastoid air cells: Left mastoid air cells are largely opacified. No erosive changes or coalescence. Sigmoid plate intact. Vascular: Normal enhancement seen within both sigmoid sinuses and jugular bulbs. Carotid canals intact and within normal limits. Limited intracranial:  Unremarkable. Visible orbits/paranasal sinuses: Visualized globes and orbital soft tissues within normal limits. Scattered mucoperiosteal thickening present about the ethmoidal air cells and visualized maxillary sinuses. No air-fluid levels. Soft tissues: No pre or postauricular soft tissue swelling or  inflammatory changes seen bilaterally. No abscess or other collection. Visualized facial soft tissues are within normal limits. IMPRESSION: Bilateral mastoid and middle ear effusions as above, nonspecific. Correlation with physical exam for possible acute otomastoiditis recommended. No coalescence or other complication. No erosive changes to suggest cholesteatoma. Electronically Signed   By: BJeannine BogaM.D.   On: 06/24/2022 03:46    Procedures Procedures    Medications Ordered in ED Medications  oxyCODONE-acetaminophen (PERCOCET/ROXICET) 5-325 MG per tablet 1 tablet (1 tablet Oral Given 06/24/22 0156)  iohexol (OMNIPAQUE) 300 MG/ML solution 75 mL (75 mLs Intravenous Contrast Given 06/24/22 0313)  dexamethasone (DECADRON) injection 10 mg (10 mg Intravenous Given 06/24/22 0532)    ED Course/ Medical Decision Making/ A&P                           Medical Decision Making Amount and/or Complexity of Data Reviewed Labs: ordered. Radiology: ordered and independent interpretation performed. ECG/medicine tests: ordered and independent interpretation performed.  Risk Prescription drug management.   31year old female here with several weeks of ear fullness.  She has had no response to treatment with amoxicillin nor cefdinir.  She has also been using over-the-counter eardrops without relief.  She has been referred to ENT but cannot be seen for 2 months.  She  is afebrile, nontoxic.  She does have left ear effusion and appears to have blood pooled behind right TM but no signs of TM rupture.  Right mastoid mildly tender but no swelling or erythema.  Left mastoid non-tender.  Given prolonged symptoms, will obtain labs, CT temporal bones.  Labs as above-- no leukocytosis.  No electrolyte derangement.  CT with mastoid and middle ear effusions, questionable otomastoiditis.  Will discuss with ENT.   4:55 AM Discussed with on call ENT, Dr. Fredric Dine-- based on clinical picture, doubts acute  mastoiditis.  Suspect likely just mastoid effusion which can persist for up to 3 months, abx would likely not be beneficial.  Can give dose of decadron now, have her continue nasal spray, drops, etc.  Clinic follow-up and will do hearing test at that time and if fails, tympanostomy tubes.  Plan discussed with patient, she is agreeable.  Call call ENT office to see if she can get an earlier appt.  Return here for new concerns.  Final Clinical Impression(s) / ED Diagnoses Final diagnoses:  Disorder of both middle ears or mastoids    Rx / DC Orders ED Discharge Orders          Ordered    fluticasone (FLONASE) 50 MCG/ACT nasal spray  Daily        06/24/22 0513              Larene Pickett, PA-C 06/24/22 0094    Maudie Flakes, MD 06/24/22 [phone]

## 2022-06-24 NOTE — ED Notes (Signed)
Patient transported to CT 

## 2022-06-24 NOTE — Discharge Instructions (Addendum)
Follow-up with ENT-- I would call the office and see if they can get you a sooner appt. Return here for new concerns.

## 2023-05-06 NOTE — Progress Notes (Signed)
 Otolaryngology Clinic Note  HPI:    Chief Complaint  Patient presents with  . Headache    HA, dizziness intermittent x 1 week     Sarah Miles is a 32 y.o. female who presents as a new patient for one week of dizziness and sore throat. States her daughter was sick two weeks ago. Last week patient developed a low grade fever for 2 days with sore throat, swollen glands in her neck and imbalance/dizziness associated with standing too quickly. Overall her symptoms are improved. No medical therapy. Hearing is described as good bilaterally. No otalgia, otorrhea, ear pruritus, tinnitus or vertigo associated with head rotation/rolling over in bed. She is breathing and swallowing without difficulty. No nasal obstruction, purulent rhinorrhea or cough.  History of bilateral tympanostomy tubes placed by Dr. Carlie on 07/09/22 for ETD.  PMH/Meds/All/SocHx/FamHx/ROS:   No past medical history on file.  No past surgical history on file.  No family history of bleeding disorders, wound healing problems or difficulty with anesthesia.       Current Outpatient Medications:  .  diethylpropion 25 mg tab, Take 25 mg by mouth 2 (two) times a day., Disp: , Rfl:  .  fluticasone  propionate (FLONASE ) 50 mcg/spray nasal spray, 1 spray., Disp: , Rfl:   A complete ROS was performed with pertinent positives/negatives noted in the HPI. The remainder of the ROS are negative.    Physical Exam:    There were no vitals taken for this visit.   General Awake, at baseline alertness during examination.  Eyes No scleral icterus or conjunctival hemorrhage. Globe position appears normal. EOMI.   Right Ear EAC patent, TM with tympanostomy tube patent and intact (in good position) with minimal cerumen at the base. Middle ear well aerated. No signs of infection.   Left Ear EAC patent, TM with tympanostomy tube patent and intact (in good position) with minimal cerumen at the base. Middle ear well aerated. No signs of  infection.   Nose Patent, no polyps or masses seen on anterior rhinoscopy.  Oral cavity No mucosal lesions or tumors seen. Tongue midline.   Oropharynx Symmetric tonsils, 2+.   Neck No abnormal cervical lymphadenopathy. No thyromegaly. No thyroid masses palpated.  Cardio-vascular No cyanosis.  Pulmonary No audible stridor. Breathing easily with no labor.  Neuro Symmetric facial movement.   Psychiatry Appropriate affect and mood for clinic visit.   Independent Review of Additional Tests or Records:  Medical records.   Procedures:  None   Impression & Plans:  Sarah Miles is a 32 y.o. female with history and exam most consistent with recent acute upper respiratory infection, likely viral. Clinically improving. Orthostatic BP measurements do not demonstrate a significant drop in BP with postural change or reproduce dizziness. Tympanostomy tubes are intact and patent without signs of infection. Symptomatic care measures discussed including rest, hydration and motrin  or tylenol  as needed for pain relief. Monitor for new or worsening symptoms. Recommend recheck of ears and tube status in six months.   Patient agrees with the plan.   Velia Pry, PA-C [facility]

## 2023-12-24 NOTE — L&D Delivery Note (Signed)
 Delivery Note Sarah Miles is a 33 y.o. G3P2002 at [redacted]w[redacted]d admitted for active labor.   GBS Status:   reported as negative  Labor course: Initial SVE: 7/90/-2. Augmentation with: AROM. She then progressed to complete.  ROM: 0h 27m with clear fluid  Birth: After a brief 2nd stage, she delivered a Live born female  Birth Weight:   APGAR: 9, 9  Newborn Delivery   Birth date/time: 08/10/2024 03:19:00 Delivery type: Vaginal, Spontaneous        Delivered via spontaneous vaginal delivery (Presentation: OA ). Nuchal cord present: Yes. Delivered through. Shoulders and body delivered in usual fashion. Infant placed directly on mom's abdomen for bonding/skin-to-skin, baby dried and stimulated. Cord clamped x 2 after 1 minute and cut by FOB.  Cord blood collected. Placenta delivered-Spontaneous with 3 vessels. 20u Pitocin  in 500cc LR given as a bolus prior to delivery of placenta.  Fundus firm with massage. Placenta inspected and appears to be intact with a 3 VC.  Sponge and instrument count were correct x2.  Intrapartum complications:  None Anesthesia:  none Lacerations:  none Suture Repair:  EBL (mL):220.00    Mom to postpartum.  Baby to Couplet care / Skin to Skin. Placenta to L&D   Plans to Breast and bottlefeed Contraception: condoms Circumcision: N/A  Note sent to [facility]: N/A, [facility] pt for pp visit.  Delivery Report:  Review the Delivery Report for details.     Signed: Cathlean Ely, DNP,CNM 08/10/2024, 3:49 AM

## 2024-08-10 ENCOUNTER — Other Ambulatory Visit: Payer: Self-pay

## 2024-08-10 ENCOUNTER — Encounter (HOSPITAL_COMMUNITY): Payer: Self-pay | Admitting: Obstetrics and Gynecology

## 2024-08-10 ENCOUNTER — Inpatient Hospital Stay (HOSPITAL_COMMUNITY)
Admission: AD | Admit: 2024-08-10 | Discharge: 2024-08-11 | DRG: 807 | Disposition: A | Discharge status: Home / Self Care | Payer: MEDICAID | Attending: Obstetrics and Gynecology | Admitting: Obstetrics and Gynecology

## 2024-08-10 DIAGNOSIS — Z3A38 38 weeks gestation of pregnancy: Secondary | ICD-10-CM

## 2024-08-10 DIAGNOSIS — Z603 Acculturation difficulty: Secondary | ICD-10-CM | POA: Diagnosis present

## 2024-08-10 DIAGNOSIS — Z833 Family history of diabetes mellitus: Secondary | ICD-10-CM

## 2024-08-10 DIAGNOSIS — O9081 Anemia of the puerperium: Secondary | ICD-10-CM | POA: Diagnosis not present

## 2024-08-10 DIAGNOSIS — O26893 Other specified pregnancy related conditions, third trimester: Secondary | ICD-10-CM | POA: Diagnosis present

## 2024-08-10 DIAGNOSIS — Z3490 Encounter for supervision of normal pregnancy, unspecified, unspecified trimester: Principal | ICD-10-CM

## 2024-08-10 LAB — TYPE AND SCREEN
ABO/RH(D): O POS
Antibody Screen: NEGATIVE

## 2024-08-10 MED ORDER — OXYTOCIN BOLUS FROM INFUSION: 333.0000 mL | Freq: Once | INTRAVENOUS | Status: AC

## 2024-08-10 MED ORDER — OXYCODONE-ACETAMINOPHEN 5-325 MG PO TABS: 2.0000 | ORAL_TABLET | ORAL | Status: DC | PRN

## 2024-08-10 MED ORDER — ACETAMINOPHEN 325 MG PO TABS
650.0000 mg | ORAL_TABLET | ORAL | Status: DC | PRN
  Administered 2024-08-10: 650 mg via ORAL
  Filled 2024-08-10 (×2): qty 2

## 2024-08-10 MED ORDER — LIDOCAINE HCL (PF) 1 % IJ SOLN: 30.0000 mL | INTRAMUSCULAR | Status: DC | PRN

## 2024-08-10 MED ORDER — BISACODYL 10 MG RE SUPP: 10.0000 mg | Freq: Every day | RECTAL | Status: DC | PRN

## 2024-08-10 MED ORDER — OXYTOCIN-SODIUM CHLORIDE 30-0.9 UT/500ML-% IV SOLN: 2.5000 [IU]/h | INTRAVENOUS | Status: DC

## 2024-08-10 MED ORDER — METHYLERGONOVINE MALEATE 0.2 MG/ML IJ SOLN: 0.2000 mg | INTRAMUSCULAR | Status: DC | PRN

## 2024-08-10 MED ORDER — TETANUS-DIPHTH-ACELL PERTUSSIS 5-2.5-18.5 LF-MCG/0.5 IM SUSY: 0.5000 mL | PREFILLED_SYRINGE | Freq: Once | INTRAMUSCULAR | Status: DC

## 2024-08-10 MED ORDER — MEASLES, MUMPS & RUBELLA VAC IJ SOLR: 0.5000 mL | Freq: Once | INTRAMUSCULAR | Status: DC

## 2024-08-10 MED ORDER — ONDANSETRON HCL 4 MG/2ML IJ SOLN: 4.0000 mg | INTRAMUSCULAR | Status: DC | PRN

## 2024-08-10 MED ORDER — OXYCODONE-ACETAMINOPHEN 5-325 MG PO TABS: 1.0000 | ORAL_TABLET | ORAL | Status: DC | PRN

## 2024-08-10 MED ORDER — SIMETHICONE 80 MG PO CHEW: 80.0000 mg | CHEWABLE_TABLET | ORAL | Status: DC | PRN

## 2024-08-10 MED ORDER — FLEET ENEMA RE ENEM: 1.0000 | ENEMA | Freq: Every day | RECTAL | Status: DC | PRN

## 2024-08-10 MED ORDER — MEDROXYPROGESTERONE ACETATE 150 MG/ML IM SUSP: 150.0000 mg | INTRAMUSCULAR | Status: DC | PRN

## 2024-08-10 MED ORDER — DIBUCAINE (PERIANAL) 1 % EX OINT: 1.0000 | TOPICAL_OINTMENT | CUTANEOUS | Status: DC | PRN

## 2024-08-10 MED ORDER — LACTATED RINGERS IV SOLN: 500.0000 mL | INTRAVENOUS | Status: DC | PRN

## 2024-08-10 MED ORDER — LACTATED RINGERS IV SOLN
INTRAVENOUS | Status: DC
Start: 2024-08-10 — End: 2024-08-10

## 2024-08-10 MED ORDER — METHYLERGONOVINE MALEATE 0.2 MG PO TABS: 0.2000 mg | ORAL_TABLET | ORAL | Status: DC | PRN

## 2024-08-10 MED ORDER — FENTANYL CITRATE (PF) 100 MCG/2ML IJ SOLN: 50.0000 ug | INTRAMUSCULAR | Status: DC | PRN

## 2024-08-10 MED ORDER — SOD CITRATE-CITRIC ACID 500-334 MG/5ML PO SOLN
30.0000 mL | ORAL | Status: DC | PRN
Start: 2024-08-10 — End: 2024-08-10

## 2024-08-10 MED ORDER — BENZOCAINE-MENTHOL 20-0.5 % EX AERO: 1.0000 | INHALATION_SPRAY | CUTANEOUS | Status: DC | PRN

## 2024-08-10 MED ORDER — COCONUT OIL OIL: 1.0000 | TOPICAL_OIL | Status: DC | PRN

## 2024-08-10 MED ORDER — FERROUS SULFATE 325 (65 FE) MG PO TABS
325.0000 mg | ORAL_TABLET | ORAL | Status: DC
  Filled 2024-08-10: qty 1

## 2024-08-10 MED ORDER — SENNOSIDES-DOCUSATE SODIUM 8.6-50 MG PO TABS
2.0000 | ORAL_TABLET | ORAL | Status: DC
  Administered 2024-08-11: 2 via ORAL
  Filled 2024-08-10 (×2): qty 2

## 2024-08-10 MED ORDER — ONDANSETRON HCL 4 MG/2ML IJ SOLN: 4.0000 mg | Freq: Four times a day (QID) | INTRAMUSCULAR | Status: DC | PRN

## 2024-08-10 MED ORDER — FENTANYL CITRATE (PF) 100 MCG/2ML IJ SOLN
INTRAMUSCULAR | Status: AC
  Administered 2024-08-10: 50 ug via INTRAVENOUS
  Filled 2024-08-10: qty 2

## 2024-08-10 MED ORDER — DIPHENHYDRAMINE HCL 25 MG PO CAPS: 25.0000 mg | ORAL_CAPSULE | Freq: Four times a day (QID) | ORAL | Status: DC | PRN

## 2024-08-10 MED ORDER — ACETAMINOPHEN 325 MG PO TABS: 650.0000 mg | ORAL_TABLET | ORAL | Status: DC | PRN

## 2024-08-10 MED ORDER — IBUPROFEN 600 MG PO TABS
600.0000 mg | ORAL_TABLET | Freq: Four times a day (QID) | ORAL | Status: DC
  Administered 2024-08-10 – 2024-08-11 (×5): 600 mg via ORAL
  Filled 2024-08-10 (×6): qty 1

## 2024-08-10 MED ORDER — WITCH HAZEL-GLYCERIN EX PADS: 1.0000 | MEDICATED_PAD | CUTANEOUS | Status: DC | PRN

## 2024-08-10 MED ORDER — PRENATAL MULTIVITAMIN CH
1.0000 | ORAL_TABLET | Freq: Every day | ORAL | Status: DC
  Administered 2024-08-11: 1 via ORAL
  Filled 2024-08-10 (×2): qty 1

## 2024-08-10 MED ORDER — ONDANSETRON HCL 4 MG PO TABS: 4.0000 mg | ORAL_TABLET | ORAL | Status: DC | PRN

## 2024-08-10 MED ORDER — OXYTOCIN-SODIUM CHLORIDE 30-0.9 UT/500ML-% IV SOLN
INTRAVENOUS | Status: AC
  Administered 2024-08-10: 333 mL via INTRAVENOUS
  Filled 2024-08-10: qty 500

## 2024-08-10 NOTE — H&P (Signed)
 OBSTETRIC ADMISSION HISTORY AND PHYSICAL  Sarah Miles is a 33 y.o. female G3P2002 with IUP at [redacted]w[redacted]d by LMP presenting for SOL. She reports +FMs, No LOF, no VB, no blurry vision, headaches or peripheral edema, and RUQ pain.  She plans on breast and formula feeding. She is undecided for birth control. She received her prenatal care at [facility]   Dating: By LMP --->  Estimated Date of Delivery: 08/19/24  Sono:   @[redacted]w[redacted]d , CWD, normal female anatomy, breech presentation, posterior placental lie, 297g, 42% EFW   Prenatal History/Complications:  - Language barrier, Spanish  Past Medical History: Past Medical History:  Diagnosis Date   Medical history non-contributory     Past Surgical History: Past Surgical History:  Procedure Laterality Date   APPENDECTOMY      Obstetrical History: OB History     Gravida  3   Para  2   Term  2   Preterm      AB      Living  2      SAB      IAB      Ectopic      Multiple  0   Live Births  2           Social History Social History   Socioeconomic History   Marital status: Married    Spouse name: Not on file   Number of children: Not on file   Years of education: Not on file   Highest education level: Not on file  Occupational History   Not on file  Tobacco Use   Smoking status: Never   Smokeless tobacco: Never  Vaping Use   Vaping status: Never Used  Substance and Sexual Activity   Alcohol use: No   Drug use: No   Sexual activity: Yes  Other Topics Concern   Not on file  Social History Narrative   Not on file   Social Drivers of Health   Financial Resource Strain: Not on file  Food Insecurity: Not on file  Transportation Needs: Not on file  Physical Activity: Not on file  Stress: Not on file  Social Connections: Unknown (05/05/2022)   Received from [facility]   Social Network    Social Network: Not on file    Family History: Family History  Problem Relation Age of Onset   Diabetes  Maternal Aunt    Diabetes Paternal Grandfather     Allergies: No Known Allergies  Medications Prior to Admission  Medication Sig Dispense Refill Last Dose/Taking   fluticasone  (FLONASE ) 50 MCG/ACT nasal spray Place 1 spray into both nostrils daily. 16 g 2    ibuprofen  (ADVIL ) 600 MG tablet Take 1 tablet (600 mg total) by mouth every 6 (six) hours. 30 tablet 0    ID NOW COVID-19 KIT TEST AS DIRECTED TODAY      Prenatal Vit-Fe Fumarate-FA (PRENATAL MULTIVITAMIN) TABS tablet Take 1 tablet by mouth daily at 12 noon.        Review of Systems   All systems reviewed and negative except as stated in HPI  unknown if currently breastfeeding. General appearance: alert, cooperative, appears stated age, and moderate distress Lungs: clear to auscultation bilaterally Heart: regular rate and rhythm Abdomen: soft, non-tender; bowel sounds normal Pelvic: adequate, proven to 3331g Extremities: Homans sign is negative, no sign of DVT Presentation: cephalic Fetal monitoringBaseline: 140 bpm, Variability: Good {> 6 bpm), Accelerations: Reactive, and Decelerations: Absent Uterine activityFrequency: Every 5 minutes  Dilation: 10 Effacement (%):  100 Station: 0 Exam by:: Sherrell Ely CNM   Prenatal labs: ABO, Rh:  O positive Antibody:  negative Rubella:  immune RPR:   non-reactive HBsAg:   non-reactive HIV:   non-reactive GBS:     negative Lab Results  Component Value Date   GBS Negative 03/11/2018   GTT wnl Genetic screening negative MSAFP Anatomy US  normal female  There is no immunization history for the selected administration types on file for this patient.  Prenatal Transfer Tool  Maternal Diabetes: No Genetic Screening: Normal Maternal Ultrasounds/Referrals: Normal Fetal Ultrasounds or other Referrals:  None Maternal Substance Abuse:  No Significant Maternal Medications:  None Significant Maternal Lab Results: Group B Strep negative Number of Prenatal  Visits:greater than 3 verified prenatal visits Maternal Vaccinations:TDap and Flu Other Comments:  None   No results found for this or any previous visit (from the past 24 hours).  Patient Active Problem List   Diagnosis Date Noted   COVID-19 affecting childbirth 05/21/2021   Uterine contractions during pregnancy 05/20/2021    Assessment/Plan:  Sarah Miles is a 33 y.o. G3P2002 at [redacted]w[redacted]d here for SOL  #Labor:Expectant management. AROM +/- Pitocin  as indicated #Pain: Per pt request #FWB: Cat I #GBS status:  negative #Feeding: Breastmilk  and Formula #Reproductive Life planning: Undecided #Circ:  not applicable  Mardy Shropshire, MD  08/10/2024, 3:08 AM

## 2024-08-10 NOTE — Progress Notes (Signed)
 LULLA Loges used to answer all questions and told to use call bell for concerns. No concerns at this time.

## 2024-08-10 NOTE — Discharge Summary (Signed)
 Postpartum Discharge Summary  Date of Service updated***     Patient Name: Sarah Miles DOB: 03-Mar-1991 MRN: [id number]  Date of admission: 08/10/2024 Delivery date:08/10/2024 Delivering provider: NEWTON MERING Date of discharge: 08/10/2024  Admitting diagnosis: Pregnancy [Z34.90] Intrauterine pregnancy: [redacted]w[redacted]d     Secondary diagnosis:  Principal Problem:   Pregnancy Active Problems:   Vaginal delivery  Additional problems: ***    Discharge diagnosis: Term Pregnancy Delivered                                              Post partum procedures:{Postpartum procedures:23558} Augmentation: AROM Complications: None  Hospital course: Onset of Labor With Vaginal Delivery      33 y.o. yo G3P2002 at [redacted]w[redacted]d was admitted in Active Labor on 08/10/2024. Labor course was complicated by nothing  Membrane Rupture Time/Date: 3:10 AM,08/10/2024  Delivery Method:Vaginal, Spontaneous Operative Delivery:N/A Episiotomy: None Lacerations:  None Patient had a postpartum course complicated by ***.  She is ambulating, tolerating a regular diet, passing flatus, and urinating well. Patient is discharged home in stable condition on 08/10/24.  Newborn Data: Birth date:08/10/2024 Birth time:3:19 AM Gender:Female Living status:Living Apgars:9 ,9  Weight:   Magnesium Sulfate received: No BMZ received: No Rhophylac:N/A MMR:N/A T-DaP:{Tdap:23962} Flu: {Qol:76036} RSV Vaccine received: No Transfusion:{Transfusion received:30440034}  Immunizations received: There is no immunization history for the selected administration types on file for this patient.  Physical exam  There were no vitals filed for this visit. General: {Exam; general:21111117} Lochia: {Desc; appropriate/inappropriate:30686::appropriate} Uterine Fundus: {Desc; firm/soft:30687} Incision: {Exam; incision:21111123} DVT Evaluation: {Exam; dvt:2111122} Labs: Lab Results  Component Value Date   WBC 7.2  06/24/2022   HGB 14.6 06/24/2022   HCT 43.0 06/24/2022   MCV 87.0 06/24/2022   PLT 383 06/24/2022      Latest Ref Rng & Units 06/24/2022    2:09 AM  CMP  Glucose 70 - 99 mg/dL 91   BUN 6 - 20 mg/dL 12   Creatinine 9.55 - 1.00 mg/dL 9.39   Sodium 864 - 854 mmol/L 139   Potassium 3.5 - 5.1 mmol/L 4.0   Chloride 98 - 111 mmol/L 105    Edinburgh Score:    04/06/2018   10:00 PM  Edinburgh Postnatal Depression Scale Screening Tool  I have been able to laugh and see the funny side of things. 0  I have looked forward with enjoyment to things. 0  I have blamed myself unnecessarily when things went wrong. 0  I have been anxious or worried for no good reason. 0  I have felt scared or panicky for no good reason. 0  Things have been getting on top of me. 0  I have been so unhappy that I have had difficulty sleeping. 0  I have felt sad or miserable. 0  I have been so unhappy that I have been crying. 0  The thought of harming myself has occurred to me. 0  Edinburgh Postnatal Depression Scale Total 0      Data saved with a previous flowsheet row definition   No data recorded  After visit meds:  Allergies as of 08/10/2024   No Known Allergies   Med Rec must be completed prior to using this [facility]***        Discharge home in stable condition Infant Feeding: {Baby feeding:23562} Infant Disposition:{CHL IP OB HOME WITH FNUYZM:76418} Discharge instruction: per  After Visit Summary and Postpartum booklet. Activity: Advance as tolerated. Pelvic rest for 6 weeks.  Diet: {OB ipzu:78888878} Future Appointments:No future appointments. Follow up Visit:     08/10/2024 Cathlean Ely, CNM

## 2024-08-10 NOTE — Lactation Note (Signed)
 This note was copied from a baby's chart. Lactation Consultation Note  Patient Name: Sarah Miles Date: 08/10/2024 Age:34 hours Reason for consult: Initial assessment;Early term 37-38.6wks  P3, 38 wks, @ 12 hrs of life. Video interpreter- [place] # [id number]. Mom is experienced breast feeder. Mom feeding breast and formula by choice. Encouraged mom, breasts respond faster/easier with each pregnancy. Encouraged mom to put baby to breast every feeding and follow up with formula if felt needed. Highlighted supply and demand, more stimuli = more milk. Also baby/mom learning each other. Hand pump and coconut oil provided- discussed uses of both. Discussed expectations @ breast- Day 1- sleepy/ feed every 3 hrs/ even 10 minutes is okay, Day 2 more awake/ feeding cues/longer feeds, and cluster feeding overnights brings milk in. Highlighted breast stimulation is tied directly to milk production. LC services and milk storage shared. Encouraged mom to call for assist anytime desired.  Maternal Data Does the patient have breastfeeding experience prior to this delivery?: Yes How long did the patient breastfeed?: 7 months with first baby, 6 months with second baby  Feeding Mother's Current Feeding Choice: Breast Milk and Formula Nipple Type: Slow - flow   Lactation Tools Discussed/Used Tools: Pump;Flanges;Coconut oil Flange Size: 21 Breast pump type: Manual Pump Education: Milk Storage;Setup, frequency, and cleaning  Interventions Interventions: Breast feeding basics reviewed;Hand express;Breast compression;Expressed milk;Coconut oil;Hand pump;Education;CDC milk storage guidelines  Discharge Pump: Manual;Personal (Per mom- she has electric pump from previous baby, used it quite a bit- not sure if it still works well)  Consult Status Consult Status: Follow-up Date: 08/11/24 Follow-up type: In-patient    [facility] 08/10/2024, 4:14 PM

## 2024-08-10 NOTE — Progress Notes (Signed)
Ordered pt meals, by Viria Alvarez Spanish Medical Interpreter. 

## 2024-08-11 LAB — CBC
HCT: 30 % — ABNORMAL LOW (ref 36.0–46.0)
Hemoglobin: 9.5 g/dL — ABNORMAL LOW (ref 12.0–15.0)
MCH: 25.2 pg — ABNORMAL LOW (ref 26.0–34.0)
MCHC: 31.7 g/dL (ref 30.0–36.0)
MCV: 79.6 fL — ABNORMAL LOW (ref 80.0–100.0)
Platelets: 282 K/uL (ref 150–400)
RBC: 3.77 MIL/uL — ABNORMAL LOW (ref 3.87–5.11)
RDW: 14.6 % (ref 11.5–15.5)
WBC: 7.8 K/uL (ref 4.0–10.5)
nRBC: 0 % (ref 0.0–0.2)

## 2024-08-11 LAB — BIRTH TISSUE RECOVERY COLLECTION (PLACENTA DONATION)

## 2024-08-11 LAB — RPR: RPR Ser Ql: NONREACTIVE

## 2024-08-11 MED ORDER — POLYETHYLENE GLYCOL 3350 17 GM/SCOOP PO POWD: 17.0000 g | Freq: Every day | ORAL | 1 refills | Status: AC | PRN

## 2024-08-11 MED ORDER — ACETAMINOPHEN 325 MG PO TABS: 650.0000 mg | ORAL_TABLET | ORAL | 0 refills | Status: AC | PRN

## 2024-08-11 MED ORDER — IBUPROFEN 600 MG PO TABS
600.0000 mg | ORAL_TABLET | Freq: Four times a day (QID) | ORAL | 0 refills | Status: AC
Start: 2024-08-11 — End: ?

## 2024-08-11 MED ORDER — FERROUS SULFATE 325 (65 FE) MG PO TABS: 325.0000 mg | ORAL_TABLET | ORAL | 0 refills | Status: AC

## 2024-08-11 NOTE — Lactation Note (Addendum)
 This note was copied from a baby's chart. Lactation Consultation Note  Patient Name: Sarah Miles Date: 08/11/2024 Age:34 hours Reason for consult: Follow-up assessment;Maternal discharge;Early term 37-38.6wks  P3, 38 wks, @ 29 hrs of life. Video interpreter to start Sarah Miles [id number], lost connection mid-visit, finished with Sarah Miles [id number]. Mom request latch assist R/T painful latching, per mom has always been an issue with this baby. Visualized a tight small tongue in moms mouth- mom unaware of history of tongue ties or limitations with other babies. Demonstrated starting with hand expression, drops of colostrum easily seen. Demonstrated working on big mouth, baby often snatches quickly and shallow- mom expresses pain. Working on bigger mouth latching key - especially if breast and bottle because bottles teach baby a small mouth works. Baby feeds cross-cradle positioning on both sides. Discussed working through [employer]- mom shared this happened with first baby.  Mom anticipates DC today. Encouraged mom to keep working on big mouth latch with baby and use EBM or coconut oil after each feed. Discussed cluster feeding overnight/ early morning brings in our milk supply, shared expectations of milk coming in. Highlighted risk of engorgement. Discussed hand pump/express to soften breasts, motrin  as anti-inflammatory, and ice packs for 10-20 minutes post feed/pumping if still over-full is the best treatments for inflamed/engorged breasts.  Maternal Data Has patient been taught Hand Expression?: Yes Does the patient have breastfeeding experience prior to this delivery?: Yes How long did the patient breastfeed?: 7 months with first baby, 6 months with second baby  Feeding Mother's Current Feeding Choice: Breast Milk and Formula  LATCH Score Latch: Grasps breast easily, tongue down, lips flanged, rhythmical sucking.  Audible Swallowing: Spontaneous and intermittent  Type of  Nipple: Everted at rest and after stimulation  Comfort (Breast/Nipple): Filling, red/small blisters or bruises, mild/mod discomfort (Mom C/O pain with latching several times)  Hold (Positioning): Full assist, staff holds infant at breast (Mom takes over)  [facility] Score: 7   Lactation Tools Discussed/Used    Interventions Interventions: Breast feeding basics reviewed;Hand express;Breast compression;Education  Discharge Pump: Personal;Manual  Consult Status Consult Status: Complete Date: 08/11/24 Follow-up type: In-patient    [facility] 08/11/2024, 9:06 AM

## 2024-08-11 NOTE — Progress Notes (Shared)
 POSTPARTUM PROGRESS NOTE  Post Partum Day 1  Subjective:  Sarah Miles is a 33 y.o. G3P3003 s/p SVD at [redacted]w[redacted]d.  No acute events overnight.  Pt denies problems with ambulating, voiding or po intake.  She denies nausea or vomiting.  Pain is well controlled.  She has had flatus. She has not had bowel movement.  Lochia Minimal.   Objective: Blood pressure 109/70, pulse 73, temperature (!) 97.3 F (36.3 C), temperature source Axillary, resp. rate 16, SpO2 98%, unknown if currently breastfeeding.  Physical Exam:  General: alert, cooperative and no distress Chest: no respiratory distress Heart:regular rate, distal pulses intact Abdomen: soft, nontender,  Uterine Fundus: firm, appropriately tender DVT Evaluation: No calf swelling or tenderness Extremities:  edema Skin: warm, dry Recent Labs    08/11/24 0604  HGB 9.5*  HCT 30.0*    Assessment/Plan: Sarah Miles is a 33 y.o. [id number] s/p SVD at [redacted]w[redacted]d   PPD#1 - Doing well Contraception: pt and husband desiring natural birth control. Discussed abstinence from intercourse for 6 weeks and 18months between pregnancies Feeding: breast Dispo: Plan for discharge tomorrow.   LOS: 1 day   Shana Squires, MD, CNM 08/11/2024, 7:13 AM

## 2024-08-11 NOTE — Patient Instructions (Signed)
 If interested in an outpatient lactation consult in office or virtually please reach out to us  at [facility] (First Floor) [address], [place] Please call [phone] and press 4 for lactation.    Lactation support groups:  [facility], Tuesdays 10:00 am -12:00 pm at [address] on the second floor in the conference room, lactating parents and lap babies welcome.  [email or web address]  [email or web address]  Si est interesado en una consulta ambulatoria sobre lactancia en el consultorio o virtualmente, comunquese con nosotros al [facility]/field seismologist (primer piso) [address] [place], [place] Por favor llame al [phone] y presione 4 para lactancia.  Grupos de apoyo para la lactancia:  [facility], martes de 10:00 a. m. a 12:00 p. m., en [address], segundo piso, sala de conferencias. Se admiten madres lactantes y bebs en regazo.  Geraldina Louder, [facility] for [facility]

## 2024-08-12 ENCOUNTER — Inpatient Hospital Stay (HOSPITAL_COMMUNITY)
Admission: AD | Admit: 2024-08-12 | Discharge: 2024-08-13 | Disposition: A | Discharge status: Home / Self Care | Payer: MEDICAID | Attending: Obstetrics & Gynecology | Admitting: Obstetrics & Gynecology

## 2024-08-12 DIAGNOSIS — O8622 Infection of bladder following delivery: Secondary | ICD-10-CM | POA: Insufficient documentation

## 2024-08-13 DIAGNOSIS — R1032 Left lower quadrant pain: Secondary | ICD-10-CM | POA: Diagnosis present

## 2024-08-13 DIAGNOSIS — O8622 Infection of bladder following delivery: Secondary | ICD-10-CM

## 2024-08-13 LAB — CBC
HCT: 33.3 % — ABNORMAL LOW (ref 36.0–46.0)
Hemoglobin: 10.2 g/dL — ABNORMAL LOW (ref 12.0–15.0)
MCH: 24.6 pg — ABNORMAL LOW (ref 26.0–34.0)
MCHC: 30.6 g/dL (ref 30.0–36.0)
MCV: 80.2 fL (ref 80.0–100.0)
Platelets: 359 K/uL (ref 150–400)
RBC: 4.15 MIL/uL (ref 3.87–5.11)
RDW: 14.6 % (ref 11.5–15.5)
WBC: 9.2 K/uL (ref 4.0–10.5)
nRBC: 0 % (ref 0.0–0.2)

## 2024-08-13 LAB — GC/CHLAMYDIA PROBE AMP (~~LOC~~) NOT AT ARMC
Chlamydia: NEGATIVE
Comment: NEGATIVE
Comment: NORMAL
Neisseria Gonorrhea: NEGATIVE

## 2024-08-13 LAB — WET PREP, GENITAL
Clue Cells Wet Prep HPF POC: NONE SEEN
Sperm: NONE SEEN
Trich, Wet Prep: NONE SEEN
WBC, Wet Prep HPF POC: >=10 — AB (ref <10)
Yeast Wet Prep HPF POC: NONE SEEN

## 2024-08-13 LAB — URINALYSIS, ROUTINE W REFLEX MICROSCOPIC
Bilirubin Urine: NEGATIVE
Glucose, UA: NEGATIVE mg/dL
Ketones, ur: NEGATIVE mg/dL
Nitrite: NEGATIVE
Protein, ur: 100 mg/dL — AB
RBC / HPF: >50 RBC/hpf (ref 0–5)
Specific Gravity, Urine: 1.033 — ABNORMAL HIGH (ref 1.005–1.030)
WBC, UA: >50 WBC/hpf (ref 0–5)
pH: 5 (ref 5.0–8.0)

## 2024-08-13 MED ORDER — AMOXICILLIN-POT CLAVULANATE 875-125 MG PO TABS: 1.0000 | ORAL_TABLET | Freq: Two times a day (BID) | ORAL | 0 refills | Status: AC

## 2024-08-13 NOTE — MAU Note (Signed)
 Sarah Miles is a 33 y.o. at Unknown here in MAU reporting:   Pt states that between 9pm and 10pm she felt 3 sharp lower left abdominal side pains. Pt describes the pain as nagging and it lingers. Pt also reports feeling dizzy.  Pt states she took ibuprofen  at home at 2109.  Pain score: 10/10 pain. Lower left abdominal pain and radiates to back.  Delivered 08/10/24 vaginal.   Lab orders placed from triage: UA Vitals:   08/13/24 0006  BP: 101/64  Resp: 16  Temp: 98.3 F (36.8 C)  SpO2: 100%

## 2024-08-13 NOTE — MAU Provider Note (Signed)
  History     CSN: [id number]  Arrival date and time: 08/12/24 2305   Event Date/Time   First Provider Initiated Contact with Patient    Chief Complaint  Patient presents with   Abdominal Pain   Dizziness    HPI  Sarah Miles is a 33 y.o. G3P3003 who is postpartum day 3 who presents to the MAU for left lower quadrant pain. Reports pain started this evening, has been constant, though increases at times and becomes like a sharp, stabbing sensation. Has felt sharp pain 3 times. Rates pain 10/10. Reports regular amount of lochia. No fevers, chills, n/v. No c/d. Denies urinary pain.  Past Medical History:  Diagnosis Date   Medical history non-contributory     Past Surgical History:  Procedure Laterality Date   APPENDECTOMY      Family History  Problem Relation Age of Onset   Diabetes Maternal Aunt    Diabetes Paternal Grandfather     Social History   Tobacco Use   Smoking status: Never   Smokeless tobacco: Never  Vaping Use   Vaping status: Never Used  Substance Use Topics   Alcohol use: No   Drug use: No    Allergies: No Known Allergies  No medications prior to admission.    ROS reviewed and pertinent positives and negatives as documented in HPI.  Physical Exam   Blood pressure 101/64, temperature 98.3 F (36.8 C), temperature source Oral, resp. rate 16, height 5' (1.524 m), SpO2 100%, unknown if currently breastfeeding.  Physical Exam Constitutional:      General: She is not in acute distress.    Appearance: Normal appearance. She is not ill-appearing.  HENT:     Head: Normocephalic and atraumatic.  Cardiovascular:     Rate and Rhythm: Normal rate.  Pulmonary:     Effort: Pulmonary effort is normal.     Breath sounds: Normal breath sounds.  Abdominal:     Palpations: Abdomen is soft.     Tenderness: There is abdominal tenderness in the suprapubic area and left lower quadrant. There is no guarding.  Musculoskeletal:        General: Normal  range of motion.  Skin:    General: Skin is warm and dry.     Findings: No rash.  Neurological:     General: No focal deficit present.     Mental Status: She is alert and oriented to person, place, and time.     MAU Course  Procedures  MDM 33 y.o. G3P3003 at PPD#3 here w c/o LLQ pain. Her exam is overall benign -- fundus firm at 3 below umbilicus, some mild suprapubic and LLQ tenderness. Wet prep negative. UA c/w presence of lochia vs UTI. Will tx for UTI based on suprapubic tenderness. Culture sent. Rec NSAIDs, will give Rx for Flexeril as well if some of her pain is MSK related, this may help. Return precautions given.  Assessment and Plan     ICD-10-CM   1. Infection of bladder following delivery  O86.22     Rx for Augmentin  x 7 days, culture ordered Rx for Flexeril given Stable for d/c   Alain Sor, MD OB Fellow, Faculty Practice [facility], Center for [facility]  08/13/2024, 4:58 AM

## 2024-08-20 ENCOUNTER — Telehealth (HOSPITAL_COMMUNITY): Payer: Self-pay | Admitting: *Deleted

## 2024-08-20 NOTE — Telephone Encounter (Signed)
 08/20/2024  Name: Sarah Miles MRN: [id number] DOB: 1991/06/17  Reason for Call:  Transition of Care Hospital Discharge Call  Contact Status: Patient Contact Status: Message LM @ [phone] Tried [phone] cannot be completed at this time. Language assistant needed: Interpreter Mode: Telephonic Interpreter Interpreter Name: Curlee [phone]        Follow-Up Questions:    Van Postnatal Depression Scale:  In the Past 7 Days:    PHQ2-9 Depression Scale:     Discharge Follow-up:    Post-discharge interventions: NA  Mliss Sieve, RN 08/20/2024 10:26
